# Patient Record
Sex: Female | Born: 1965 | Hispanic: Yes | Marital: Single | Smoking: Never smoker
Health system: Southern US, Community
[De-identification: ages and names within clinical notes are randomized; demographics above are authoritative.]

## PROBLEM LIST (undated history)

## (undated) DIAGNOSIS — K219 Gastro-esophageal reflux disease without esophagitis: Secondary | ICD-10-CM

## (undated) DIAGNOSIS — F329 Major depressive disorder, single episode, unspecified: Secondary | ICD-10-CM

## (undated) DIAGNOSIS — Z8719 Personal history of other diseases of the digestive system: Secondary | ICD-10-CM

## (undated) DIAGNOSIS — G43909 Migraine, unspecified, not intractable, without status migrainosus: Secondary | ICD-10-CM

## (undated) DIAGNOSIS — F32A Depression, unspecified: Secondary | ICD-10-CM

## (undated) DIAGNOSIS — G4700 Insomnia, unspecified: Secondary | ICD-10-CM

## (undated) HISTORY — DX: Depression, unspecified: F32.A

## (undated) HISTORY — DX: Major depressive disorder, single episode, unspecified: F32.9

## (undated) HISTORY — PX: BREAST REDUCTION SURGERY: SHX8

## (undated) HISTORY — DX: Insomnia, unspecified: G47.00

## (undated) HISTORY — DX: Migraine, unspecified, not intractable, without status migrainosus: G43.909

## (undated) HISTORY — PX: TUBAL LIGATION: SHX77

## (undated) HISTORY — DX: Gastro-esophageal reflux disease without esophagitis: K21.9

## (undated) HISTORY — PX: OTHER SURGICAL HISTORY: SHX169

## (undated) HISTORY — DX: Personal history of other diseases of the digestive system: Z87.19

## (undated) HISTORY — PX: UPPER GI ENDOSCOPY: SHX6162

---

## 2015-01-13 ENCOUNTER — Ambulatory Visit: Payer: Self-pay | Admitting: Family Medicine

## 2015-05-28 ENCOUNTER — Telehealth: Payer: Self-pay | Admitting: Family Medicine

## 2015-05-28 NOTE — Telephone Encounter (Signed)
Pt stated she wanted to come in and see you because she has had a migraine since Sunday 05/25/15 and has been gaining weight a few pounds a day. Pt doesn't want to see a PA and is concerned that she is gaining weight because she isn't hungry. Pt stated she didn't sleep last night because he head was hurting so bad she was up crying. Can you work her in your schedule this week? Pt stated she understands if she can not come in today but would like to see you sometime this week if possible. Thanks TNP

## 2015-05-28 NOTE — Telephone Encounter (Signed)
How about tomorrow of Friday at 1030?

## 2015-05-28 NOTE — Telephone Encounter (Signed)
Scheduled pt on Friday 05/30/15 @ 11:15. Ok per Dr. Sullivan LoneGilbert. Pt said to tell Dr. Sullivan LoneGilbert Thank You. Thanks TNP

## 2015-05-30 ENCOUNTER — Encounter: Payer: Self-pay | Admitting: Family Medicine

## 2015-05-30 ENCOUNTER — Ambulatory Visit (INDEPENDENT_AMBULATORY_CARE_PROVIDER_SITE_OTHER): Payer: No Typology Code available for payment source | Admitting: Family Medicine

## 2015-05-30 VITALS — BP 124/62 | HR 76 | Temp 98.0°F | Resp 16 | Wt 178.0 lb

## 2015-05-30 DIAGNOSIS — M79604 Pain in right leg: Secondary | ICD-10-CM

## 2015-05-30 DIAGNOSIS — R1084 Generalized abdominal pain: Secondary | ICD-10-CM

## 2015-05-30 DIAGNOSIS — G43809 Other migraine, not intractable, without status migrainosus: Secondary | ICD-10-CM

## 2015-05-30 DIAGNOSIS — G4700 Insomnia, unspecified: Secondary | ICD-10-CM | POA: Diagnosis not present

## 2015-05-30 DIAGNOSIS — M79606 Pain in leg, unspecified: Secondary | ICD-10-CM | POA: Diagnosis not present

## 2015-05-30 DIAGNOSIS — M79605 Pain in left leg: Secondary | ICD-10-CM

## 2015-05-30 DIAGNOSIS — F329 Major depressive disorder, single episode, unspecified: Secondary | ICD-10-CM

## 2015-05-30 DIAGNOSIS — K219 Gastro-esophageal reflux disease without esophagitis: Secondary | ICD-10-CM | POA: Diagnosis not present

## 2015-05-30 DIAGNOSIS — N921 Excessive and frequent menstruation with irregular cycle: Secondary | ICD-10-CM | POA: Diagnosis not present

## 2015-05-30 DIAGNOSIS — Z780 Asymptomatic menopausal state: Secondary | ICD-10-CM

## 2015-05-30 DIAGNOSIS — F32A Depression, unspecified: Secondary | ICD-10-CM

## 2015-05-30 MED ORDER — SERTRALINE HCL 50 MG PO TABS: 50.0000 mg | ORAL_TABLET | Freq: Every day | ORAL | Status: DC

## 2015-05-30 MED ORDER — SUMATRIPTAN SUCCINATE 50 MG PO TABS: 50.0000 mg | ORAL_TABLET | ORAL | Status: DC | PRN

## 2015-05-30 MED ORDER — TRAZODONE HCL 50 MG PO TABS: 50.0000 mg | ORAL_TABLET | Freq: Every day | ORAL | Status: DC

## 2015-05-30 MED ORDER — OMEPRAZOLE MAGNESIUM 20 MG PO TBEC: 20.0000 mg | DELAYED_RELEASE_TABLET | Freq: Two times a day (BID) | ORAL | Status: DC

## 2015-05-30 NOTE — Progress Notes (Signed)
Elizabeth Munoz  MRN: [id number] DOB: 10/10/1966  Subjective:  HPI  Pt is here because she is having migraine, leg pain, sweat, edema, weight gain and irregular periods. She reports that this has been going on for the last 6 months.   The leg pain has only been a month. She reports that when she lays down at night the leg pain and the headaches are so bad that she cant not sleep.  The pain starts in her hip area and runs down to her knees. She reports this is a un tolerable pain. She is having swelling in her hands and feet. She does not kick around when she sleeps per her husband.   The weight gain has concerned her because she had lost weight and she has gain about 11 lbs. Per her chart it is only 4 but she reports that she had lost weight since she was last seen. She reports that the sigh of food makes her sick to her stomach. She makes herself eat and when she does her GERD is worse.  Her menstrual periods have been irregular as well, she reports that she has been having 3 periods per month for about 6 months to a year, sometimes it will be 1 periods. She reports that her headaches were only around her cycles but now they are happening all the time. She reports that her mom had surgical menopause at 61. With these headaches she had a rash over her head and her right cheek. She has been having hot flashes. She reports that her last pap was about 6 years ago and she has always been normal.   Pt reports that she is Depressed and not sleeping well. There is no reported snoring nor is there any reported RLS.   There are no active problems to display for this patient.   History reviewed. No pertinent past medical history.  History   Social History  . Marital Status: Married    Spouse Name: N/A  . Number of Children: N/A  . Years of Education: N/A   Occupational History  . Not on file.   Social History Main Topics  . Smoking status: Never Smoker   . Smokeless tobacco: Not on  file  . Alcohol Use: Not on file  . Drug Use: No  . Sexual Activity: Not on file   Other Topics Concern  . Not on file   Social History Narrative    Outpatient Prescriptions Prior to Visit  Medication Sig Dispense Refill  . calcium carbonate (TUMS - DOSED IN MG ELEMENTAL CALCIUM) 500 MG chewable tablet Chew 1 tablet by mouth daily.    Marland Kitchen omeprazole (PRILOSEC OTC) 20 MG tablet Take 20 mg by mouth daily.    . cimetidine (TAGAMET) 200 MG tablet Take 200 mg by mouth daily.     No facility-administered medications prior to visit.    No Known Allergies  Review of Systems  Constitutional: Negative.   HENT: Negative.   Eyes: Negative.   Respiratory: Negative.   Cardiovascular: Negative.   Gastrointestinal: Positive for heartburn and nausea.  Genitourinary: Negative.   Musculoskeletal: Positive for myalgias.  Skin: Negative.   Neurological: Negative.   Endo/Heme/Allergies: Negative.   Psychiatric/Behavioral: Positive for depression.   Objective:  BP 124/62 mmHg  Pulse 76  Temp(Src) 98 F (36.7 C) (Oral)  Resp 16  Wt 178 lb (80.74 kg)  Physical Exam  Constitutional: She is oriented to person, place, and time and well-developed, well-nourished, and  in no distress.  HENT:  Head: Normocephalic and atraumatic.  Right Ear: External ear normal.  Left Ear: External ear normal.  Nose: Nose normal.  Mouth/Throat: Oropharynx is clear and moist.  Eyes: Conjunctivae and EOM are normal. Pupils are equal, round, and reactive to light.  Neck: Normal range of motion. Neck supple.  Cardiovascular: Normal rate, regular rhythm, normal heart sounds and intact distal pulses.   Pulmonary/Chest: Effort normal and breath sounds normal.  Abdominal: Soft. Bowel sounds are normal. There is tenderness.  Musculoskeletal: Normal range of motion.  straight leg raises and figure 4 negative.   Neurological: She is alert and oriented to person, place, and time. No cranial nerve deficit. Gait normal.   Skin: Skin is warm and dry.  Psychiatric: Mood, memory, affect and judgment normal.    Assessment and Plan :  1. Pain of lower extremity, unspecified laterality This is a very nonspecific complaint. It seems to bother her more at night when supine. This suggests the possibility of spinal stenosis. Will follow clinically as this seems to be a minor issue for the patient. She has multiple complaints today.    2. Other type of migraine May need neurology referral but this appears to be classic migraine. Hopefully this will also resolve with menopause. She has both photophobia and phonophobia with the headache. - SUMAtriptan (IMITREX) 50 MG tablet; Take 1 tablet (50 mg total) by mouth every 2 (two) hours as needed for migraine. May repeat in 2 hours if headache persists or recurs.  Dispense: 9 tablet; Refill: 12  3. Generalized abdominal pain This is most consistent with the possibility of peptic ulcer disease/gastritis. I think a GI referral will be appropriate. Again, the symptoms are very vague. May need imaging and I will leave this up to GI at this time. - CBC w/Diff - COMPLETE METABOLIC PANEL WITH GFR - H. pylori antibody, IgG - omeprazole (PRILOSEC OTC) 20 MG tablet; Take 1 tablet (20 mg total) by mouth 2 (two) times daily.  Dispense: 60 tablet; Refill: 12  4. Leg pain, bilateral See #1 - Sed Rate (ESR) - CK (Creatine Kinase)  5. Menopause Obtain LH and FSH. - [place] - LH - Ambulatory referral to Obstetrics / Gynecology  6. Menorrhagia with irregular cycle Refer to GYN. Last Pap smear was 6 years ago.- [place] - LH - Ambulatory referral to Obstetrics / Gynecology  7. Depression I think this is the major issue for this patient. Stressed this with patient and husband. Stressed that it will take 4-6 weeks for medication to start help. - TSH - sertraline (ZOLOFT) 50 MG tablet; Take 1 tablet (50 mg total) by mouth daily.  Dispense: 30 tablet; Refill: 12  8. Insomnia Hopefully  will improve his depression improves. - traZODone (DESYREL) 50 MG tablet; Take 1 tablet (50 mg total) by mouth at bedtime.  Dispense: 30 tablet; Refill: 12  9. Gastroesophageal reflux disease, esophagitis presence not specified Has already been taking PPI. Will refer to GI. - omeprazole (PRILOSEC OTC) 20 MG tablet; Take 1 tablet (20 mg total) by mouth 2 (two) times daily.  Dispense: 60 tablet; Refill: [facility] 05/30/2015 11:34 AM

## 2015-05-31 LAB — CMP14+EGFR
ALT: 12 IU/L (ref 0–32)
AST: 17 IU/L (ref 0–40)
Albumin/Globulin Ratio: 1.6 (ref 1.1–2.5)
Albumin: 4.4 g/dL (ref 3.5–5.5)
Alkaline Phosphatase: 89 IU/L (ref 39–117)
BUN/Creatinine Ratio: 32 — ABNORMAL HIGH (ref 9–23)
BUN: 21 mg/dL (ref 6–24)
CO2: 25 mmol/L (ref 18–29)
Calcium: 9.6 mg/dL (ref 8.7–10.2)
Chloride: 102 mmol/L (ref 97–108)
Creatinine, Ser: 0.65 mg/dL (ref 0.57–1.00)
GFR, EST AFRICAN AMERICAN: 119 mL/min/{1.73_m2} (ref >59)
GFR, EST NON AFRICAN AMERICAN: 103 mL/min/{1.73_m2} (ref >59)
GLOBULIN, TOTAL: 2.8 g/dL (ref 1.5–4.5)
Glucose: 71 mg/dL (ref 65–99)
Potassium: 4.8 mmol/L (ref 3.5–5.2)
SODIUM: 141 mmol/L (ref 134–144)
Total Protein: 7.2 g/dL (ref 6.0–8.5)

## 2015-05-31 LAB — CBC WITH DIFFERENTIAL/PLATELET
Basophils Absolute: 0 10*3/uL (ref 0.0–0.2)
Basos: 0 %
EOS (ABSOLUTE): 0.2 10*3/uL (ref 0.0–0.4)
Eos: 2 %
HEMATOCRIT: 39.6 % (ref 34.0–46.6)
Hemoglobin: 12.6 g/dL (ref 11.1–15.9)
IMMATURE GRANS (ABS): 0 10*3/uL (ref 0.0–0.1)
Immature Granulocytes: 0 %
LYMPHS ABS: 2 10*3/uL (ref 0.7–3.1)
Lymphs: 28 %
MCH: 26.2 pg — ABNORMAL LOW (ref 26.6–33.0)
MCHC: 31.8 g/dL (ref 31.5–35.7)
MCV: 82 fL (ref 79–97)
Monocytes Absolute: 0.7 10*3/uL (ref 0.1–0.9)
Monocytes: 9 %
Neutrophils Absolute: 4.2 10*3/uL (ref 1.4–7.0)
Neutrophils: 61 %
Platelets: 350 10*3/uL (ref 150–379)
RBC: 4.81 x10E6/uL (ref 3.77–5.28)
RDW: 13.3 % (ref 12.3–15.4)
WBC: 7 10*3/uL (ref 3.4–10.8)

## 2015-05-31 LAB — CK: Total CK: 69 U/L (ref 24–173)

## 2015-05-31 LAB — FOLLICLE STIMULATING HORMONE: FSH: 13.1 m[IU]/mL

## 2015-05-31 LAB — H. PYLORI ANTIBODY, IGG: H Pylori IgG: 6.3 U/mL — ABNORMAL HIGH (ref 0.0–0.8)

## 2015-05-31 LAB — SEDIMENTATION RATE: Sed Rate: 9 mm/h (ref 0–40)

## 2015-05-31 LAB — LUTEINIZING HORMONE: LH: 10.9 m[IU]/mL

## 2015-05-31 LAB — TSH: TSH: 2.39 u[IU]/mL (ref 0.450–4.500)

## 2015-06-03 ENCOUNTER — Other Ambulatory Visit: Payer: Self-pay | Admitting: Emergency Medicine

## 2015-06-03 ENCOUNTER — Telehealth: Payer: Self-pay | Admitting: Emergency Medicine

## 2015-06-03 DIAGNOSIS — K219 Gastro-esophageal reflux disease without esophagitis: Secondary | ICD-10-CM

## 2015-06-03 DIAGNOSIS — J309 Allergic rhinitis, unspecified: Secondary | ICD-10-CM

## 2015-06-03 DIAGNOSIS — F419 Anxiety disorder, unspecified: Secondary | ICD-10-CM

## 2015-06-03 DIAGNOSIS — A048 Other specified bacterial intestinal infections: Secondary | ICD-10-CM

## 2015-06-03 DIAGNOSIS — R1084 Generalized abdominal pain: Secondary | ICD-10-CM

## 2015-06-03 MED ORDER — ALPRAZOLAM 0.5 MG PO TABS: 0.5000 mg | ORAL_TABLET | Freq: Every evening | ORAL | Status: DC | PRN

## 2015-06-03 MED ORDER — OMEPRAZOLE MAGNESIUM 20 MG PO TBEC: 20.0000 mg | DELAYED_RELEASE_TABLET | Freq: Two times a day (BID) | ORAL | Status: DC

## 2015-06-03 MED ORDER — LORATADINE 10 MG PO TABS: 10.0000 mg | ORAL_TABLET | Freq: Every day | ORAL | Status: DC

## 2015-06-03 NOTE — Telephone Encounter (Signed)
When I called the pt to tell her labs she told me she forgot to ask you for refills. I wanted to make sure they were ok... Omeprazole Loratidine  i know these are probaly ok but she also wants  Alprazolam and Nystatin refill. She said she uses Nystatin for her stomach.

## 2015-06-03 NOTE — Telephone Encounter (Signed)
OK to refill all for 1 year except for Nystatin--does not need that. GI referral pending for those issues.

## 2015-06-03 NOTE — Telephone Encounter (Signed)
Spoke with pt, informed of below. bb

## 2015-06-03 NOTE — Telephone Encounter (Signed)
See other note

## 2015-06-04 ENCOUNTER — Other Ambulatory Visit: Payer: Self-pay | Admitting: Emergency Medicine

## 2015-06-04 DIAGNOSIS — K219 Gastro-esophageal reflux disease without esophagitis: Secondary | ICD-10-CM

## 2015-06-04 DIAGNOSIS — R1084 Generalized abdominal pain: Secondary | ICD-10-CM

## 2015-06-04 MED ORDER — OMEPRAZOLE MAGNESIUM 20 MG PO TBEC: 20.0000 mg | DELAYED_RELEASE_TABLET | Freq: Two times a day (BID) | ORAL | Status: DC

## 2015-06-05 ENCOUNTER — Other Ambulatory Visit: Payer: Self-pay | Admitting: Emergency Medicine

## 2015-06-05 DIAGNOSIS — J329 Chronic sinusitis, unspecified: Secondary | ICD-10-CM

## 2015-06-05 MED ORDER — AMOXICILLIN 500 MG PO CAPS: 500.0000 mg | ORAL_CAPSULE | Freq: Two times a day (BID) | ORAL | Status: DC

## 2015-06-19 ENCOUNTER — Ambulatory Visit: Payer: Self-pay | Admitting: Gastroenterology

## 2015-06-19 ENCOUNTER — Encounter: Payer: Self-pay | Admitting: Obstetrics and Gynecology

## 2015-06-19 ENCOUNTER — Ambulatory Visit (INDEPENDENT_AMBULATORY_CARE_PROVIDER_SITE_OTHER): Payer: No Typology Code available for payment source | Admitting: Obstetrics and Gynecology

## 2015-06-19 VITALS — BP 126/76 | HR 83 | Ht 64.0 in | Wt 174.5 lb

## 2015-06-19 DIAGNOSIS — Z124 Encounter for screening for malignant neoplasm of cervix: Secondary | ICD-10-CM | POA: Diagnosis not present

## 2015-06-19 DIAGNOSIS — G43909 Migraine, unspecified, not intractable, without status migrainosus: Secondary | ICD-10-CM

## 2015-06-19 DIAGNOSIS — F39 Unspecified mood [affective] disorder: Secondary | ICD-10-CM

## 2015-06-19 DIAGNOSIS — N921 Excessive and frequent menstruation with irregular cycle: Secondary | ICD-10-CM

## 2015-06-19 DIAGNOSIS — N943 Premenstrual tension syndrome: Secondary | ICD-10-CM

## 2015-06-19 DIAGNOSIS — Z1239 Encounter for other screening for malignant neoplasm of breast: Secondary | ICD-10-CM

## 2015-06-19 DIAGNOSIS — N938 Other specified abnormal uterine and vaginal bleeding: Secondary | ICD-10-CM

## 2015-06-19 DIAGNOSIS — K219 Gastro-esophageal reflux disease without esophagitis: Secondary | ICD-10-CM | POA: Insufficient documentation

## 2015-06-19 HISTORY — DX: Migraine, unspecified, not intractable, without status migrainosus: G43.909

## 2015-06-19 MED ORDER — MEDROXYPROGESTERONE ACETATE 10 MG PO TABS: 10.0000 mg | ORAL_TABLET | Freq: Every day | ORAL | Status: DC

## 2015-06-19 NOTE — Progress Notes (Signed)
Patient ID: Elizabeth Munoz, female   DOB: [birth year], 49 y.o.   MRN: [id number]  Pt. Referred from Dr. Sullivan LoneGilbert for heavy/irregular periods. This has worsened for past 9 months. C/O hot flashes and night sweats, acne (seen dermatology for this). Also c/o weight gain. Pt. Took ocp's but they were too expensive and pt. Had no insurance at the time. They did help with menses, weight control and acne. [facility] but not in the past 2 yrs. Pt. And husband state she has not had an ultrasound or biopsy in the past. Had CT scan for migraine headaches in the past-negative.

## 2015-06-22 DIAGNOSIS — N921 Excessive and frequent menstruation with irregular cycle: Secondary | ICD-10-CM | POA: Insufficient documentation

## 2015-06-22 DIAGNOSIS — N943 Premenstrual tension syndrome: Secondary | ICD-10-CM

## 2015-06-22 DIAGNOSIS — F39 Unspecified mood [affective] disorder: Secondary | ICD-10-CM | POA: Insufficient documentation

## 2015-06-22 HISTORY — DX: Premenstrual tension syndrome: N94.3

## 2015-06-22 NOTE — Progress Notes (Signed)
Subjective:     Elizabeth Munoz is a 49 y.o. [phone] woman who presents for irregular menses. Patient's last menstrual period was 06/05/2015 (approximate). Menarche age: [age]. Periods are irregular q 2 weeks, lasting 5-6 days, with passage of large clots.  Symptoms have worsened over the past 9 months.  Uses super sized pads for menstrual cycles.  Dysmenorrhea:moderate, occurring throughout menses. Cyclic symptoms include: headache, irritability, moodiness, weight gain, depression, and hot flushes.  Also reports loss of libido x 1 year. Has recurring acne,  Was initiated on OCPs ~ 2 years ago by PCP, which helped with acne but did not help menses.  Menses affects ADL in that patient cannot work, and avoids social events during menses due to heavy flow and migraines. Is taking Xanax and Zoloft for depression. Uses Imitrex for migraines. Current contraception: tubal ligation.  History of abnormal Pap smear: no.  Last pap smear was ~ 2-3 years ago.    Past Medical History  Diagnosis Date  . Insomnia   . GERD (gastroesophageal reflux disease)   . Depression    Family History  Problem Relation Age of Onset  . Alzheimer's disease Mother   . Diabetes Father   . Colon cancer Father   . Diabetes Brother    Past Surgical History  Procedure Laterality Date  . Breast reduction surgery    . Cesarean section      x 2   . History   Social History  . Marital Status: Married    Spouse Name: N/A  . Number of Children: N/A  . Years of Education: N/A   Occupational History  . Not on file.   Social History Main Topics  . Smoking status: Never Smoker   . Smokeless tobacco: Not on file  . Alcohol Use: No  . Drug Use: No  . Sexual Activity: Not Currently   Other Topics Concern  . Not on file   Social History Narrative   Current Outpatient Prescriptions on File Prior to Visit  Medication Sig Dispense Refill  . ALPRAZolam (XANAX) 0.5 MG tablet Take 1 tablet (0.5 mg total) by mouth at  bedtime as needed for anxiety. 30 tablet 5  . calcium carbonate (TUMS - DOSED IN MG ELEMENTAL CALCIUM) 500 MG chewable tablet Chew 1 tablet by mouth daily.    Marland Kitchen. loratadine (CLARITIN) 10 MG tablet Take 1 tablet (10 mg total) by mouth daily. 30 tablet 12  . omeprazole (PRILOSEC OTC) 20 MG tablet Take 1 tablet (20 mg total) by mouth 2 (two) times daily. 60 tablet 12  . sertraline (ZOLOFT) 50 MG tablet Take 1 tablet (50 mg total) by mouth daily. 30 tablet 12  . SUMAtriptan (IMITREX) 50 MG tablet Take 1 tablet (50 mg total) by mouth every 2 (two) hours as needed for migraine. May repeat in 2 hours if headache persists or recurs. 9 tablet 12  . traZODone (DESYREL) 50 MG tablet Take 1 tablet (50 mg total) by mouth at bedtime. 30 tablet 12  . nystatin (MYCOSTATIN) 100000 UNIT/ML suspension Take 5 mLs by mouth 4 (four) times daily.     No current facility-administered medications on file prior to visit.     Review of Systems Pertinent items are noted in HPI.     Objective:    BP 126/76 mmHg  Pulse 83  Ht 5\' 4"  (1.626 m)  Wt 174 lb 8 oz (79.153 kg)  BMI 29.94 kg/m2  LMP 06/05/2015 (Approximate) General appearance: alert and no distress Throat:  lips, mucosa, and tongue normal; teeth and gums normal Neck: supple, symmetrical, trachea midline and thyroid not enlarged, symmetric, no tenderness/mass/nodules Abdomen: soft, non-tender; bowel sounds normal; no masses,  no organomegaly Pelvic: cervix normal in appearance, external genitalia normal, no adnexal masses or tenderness, no cervical motion tenderness, positive findings: uterine enlargement 12 week size, rectovaginal septum normal and vagina normal without discharge Extremities: extremities normal, atraumatic, no cyanosis or edema Skin: Skin color, texture, turgor normal. No rashes or lesions Neurologic: Grossly normal      Lab Review:  Lab Results  Component Value Date   WBC 7.0 05/30/2015   HCT 39.6 05/30/2015   Lab Results   Component Value Date   TSH 2.390 05/30/2015    Results for REYES SHONDRA, CAPPS (MRN [id number]) as of 06/22/2015 15:24  Ref. Range 05/30/2015 12:43  LH Latest Units: mIU/mL 10.9  FSH Latest Units: mIU/mL 13.1     Assessment:    The patient has menometrorrhagia, possibly perimenopausal bleeding with PMS symptoms.    Plan:    Pap smear. Pelvic ultrasound.   Patient has abnormal uterine bleeding. She has a normal exam, no evidence of lesions. Will perform endometrial biopsy at next visit. Discussed management options for abnormal uterine bleeding including tranexamic acid (Lysteda), oral progesterone, Depo Provera, Mirena IUD, endometrial ablation (Novasure/Hydrothermal Ablation) or hysterectomy as definitive surgical management.  Discussed risks and benefits of each method.   Patient is considering hysterectomy. Given handouts on several available options for patients to review at home.  Provera prescribed as needed for now,  bleeding precautions reviewed.  RTC in 1-2 weeks.  Patient also desires referral for Mammogram as it has been several years since last one.   Hildred Laser, MD [facility]

## 2015-06-24 ENCOUNTER — Ambulatory Visit (INDEPENDENT_AMBULATORY_CARE_PROVIDER_SITE_OTHER): Payer: No Typology Code available for payment source | Admitting: Gastroenterology

## 2015-06-24 ENCOUNTER — Other Ambulatory Visit: Payer: Self-pay

## 2015-06-24 VITALS — BP 145/77 | HR 105 | Temp 98.7°F | Ht 64.0 in | Wt 176.0 lb

## 2015-06-24 DIAGNOSIS — R140 Abdominal distension (gaseous): Secondary | ICD-10-CM | POA: Diagnosis not present

## 2015-06-24 DIAGNOSIS — B9681 Helicobacter pylori [H. pylori] as the cause of diseases classified elsewhere: Secondary | ICD-10-CM

## 2015-06-24 DIAGNOSIS — G8929 Other chronic pain: Secondary | ICD-10-CM

## 2015-06-24 DIAGNOSIS — R1013 Epigastric pain: Secondary | ICD-10-CM

## 2015-06-24 DIAGNOSIS — A048 Other specified bacterial intestinal infections: Secondary | ICD-10-CM

## 2015-06-24 LAB — PAP IG AND HPV HIGH-RISK
HPV, high-risk: NEGATIVE
PAP Smear Comment: 0

## 2015-06-24 MED ORDER — BIS SUBCIT-METRONID-TETRACYC 140-125-125 MG PO CAPS: 3.0000 | ORAL_CAPSULE | Freq: Three times a day (TID) | ORAL | Status: DC

## 2015-06-24 NOTE — Progress Notes (Signed)
Gastroenterology Consultation  Referring Provider:     Maple Hudson.,* Primary Care Physician:  Megan Mans, MD Primary Gastroenterologist:  Dr. Servando Snare     Reason for Consultation:     Abdominal pain and H. pylori infection        HPI:   Elizabeth Munoz is a 49 y.o. y/o female referred for consultation & management of abdominal pain and H. pylori infection by Dr. Megan Mans, MD.  This patient comes today with a few months of abdominal pain. The patient states that over the last few months she has had worsening abdominal pain with reflux and a lot of burping. The patient also reports that abdominal pain is in the epigastric area. The patient was put on omeprazole and states that her medication did not stop her symptoms. The patient also states she was put on nystatin but is not sure why she was put on nystatin. The patient now comes with a report of the pain interfering with her life. She states she eats very small amounts of food and has been gaining weight. She also reports that she has been less active. The patient comes with her husband who reports that she does not eat a lot but she does not move around a lot. The patient was having symptoms usually at night about 4 hours after dinner and typically in the middle of night she wakes up with a feeling of choking. The patient was found to have H. pylori positive but has not been treated for that yet. Told 3 weeks ago to stop all of her medication because of the symptoms and to see me. She states that in the last 2 weeks her symptoms have been worse.  Past Medical History  Diagnosis Date  . Insomnia   . GERD (gastroesophageal reflux disease)   . Depression   . Migraine 06/19/2015    Past Surgical History  Procedure Laterality Date  . Breast reduction surgery    . Cesarean section      x 2    Prior to Admission medications   Medication Sig Start Date End Date Taking? Authorizing Provider  ALPRAZolam [name]) 0.5  MG tablet Take 1 tablet (0.5 mg total) by mouth at bedtime as needed for anxiety. 06/03/15  Yes Richard Hulen Shouts., MD  calcium carbonate (TUMS - DOSED IN MG ELEMENTAL CALCIUM) 500 MG chewable tablet Chew 1 tablet by mouth daily.   Yes Historical Provider, MD  loratadine (CLARITIN) 10 MG tablet Take 1 tablet (10 mg total) by mouth daily. 06/03/15  Yes Richard Hulen Shouts., MD  Multiple Vitamin (MULTIVITAMIN) tablet Take 1 tablet by mouth daily.   Yes Historical Provider, MD  omeprazole (PRILOSEC OTC) 20 MG tablet Take 1 tablet (20 mg total) by mouth 2 (two) times daily. 06/04/15  Yes Richard Hulen Shouts., MD  sertraline (ZOLOFT) 50 MG tablet Take 1 tablet (50 mg total) by mouth daily. 05/30/15  Yes Richard Hulen Shouts., MD  SUMAtriptan (IMITREX) 50 MG tablet Take 1 tablet (50 mg total) by mouth every 2 (two) hours as needed for migraine. May repeat in 2 hours if headache persists or recurs. 05/30/15  Yes Richard Hulen Shouts., MD  traZODone (DESYREL) 50 MG tablet Take 1 tablet (50 mg total) by mouth at bedtime. 05/30/15  Yes Richard Hulen Shouts., MD  Verapamil HCl CR 200 MG CP24 Take by mouth.   Yes Historical Provider, MD  medroxyPROGESTERone (PROVERA) 10  MG tablet Take 1 tablet (10 mg total) by mouth daily. Use for ten days Patient not taking: Reported on 06/24/2015 06/19/15   Hildred LaserAnika Cherry, MD  nystatin (MYCOSTATIN) 100000 UNIT/ML suspension Take 5 mLs by mouth 4 (four) times daily.    Historical Provider, MD    Family History  Problem Relation Age of Onset  . Alzheimer's disease Mother   . Diabetes Father   . Diabetes Brother   . Prostate cancer Father      History  Substance Use Topics  . Smoking status: Never Smoker   . Smokeless tobacco: Not on file  . Alcohol Use: No    Allergies as of 06/24/2015  . (No Known Allergies)    Review of Systems:    All systems reviewed and negative except where noted in HPI.   Physical Exam:  BP 145/77 mmHg  Pulse 105  Temp(Src) 98.7 F (37.1  C) (Oral)  Ht 5\' 4"  (1.626 m)  Wt 176 lb (79.833 kg)  BMI 30.20 kg/m2  LMP 06/05/2015 (Approximate) Patient's last menstrual period was 06/05/2015 (approximate). Psych:  Alert and cooperative. Normal mood and affect. General:   Alert,  Well-developed, well-nourished, pleasant and cooperative in NAD Head:  Normocephalic and atraumatic. Eyes:  Sclera clear, no icterus.   Conjunctiva pink. Ears:  Normal auditory acuity. Nose:  No deformity, discharge, or lesions. Mouth:  No deformity or lesions,oropharynx pink & moist. Neck:  Supple; no masses or thyromegaly. Lungs:  Respirations even and unlabored.  Clear throughout to auscultation.   No wheezes, crackles, or rhonchi. No acute distress. Heart:  Regular rate and rhythm; no murmurs, clicks, rubs, or gallops. Abdomen:  Normal bowel sounds.  No bruits.  Soft, non-tender and non-distended without masses, hepatosplenomegaly or hernias noted.  No guarding or rebound tenderness.  Negative Carnett sign.   Rectal:  Deferred.  Msk:  Symmetrical without gross deformities.  Good, equal movement & strength bilaterally. Pulses:  Normal pulses noted. Extremities:  No clubbing or edema.  No cyanosis. Neurologic:  Alert and oriented x3;  grossly normal neurologically. Skin:  Intact without significant lesions or rashes.  No jaundice. Lymph Nodes:  No significant cervical adenopathy. Psych:  Alert and cooperative. Normal mood and affect.  Imaging Studies: No results found.  Assessment and Plan:   Elizabeth Munoz is a 49 y.o. y/o female who has H. pylori infection and has not been treated for it yet. The patient also has abdominal pain with most the pain in the epigastric area she also reports that she has a lot of burping with pain associated with burping. She states that it is worse at night when she lays down and can be up to 4 hours after eating. The patient denies any carbonated drank intake or other risk factors for aerophagia. These symptoms  have been so bothersome over the last few months that she states she is not as active as she has been in the past. She also reports that she has a family history of peptic ulcer disease. The patient will be started on treatment for her H. pylori. If her symptoms do not improve then she may need to be on a chronic PPI versus an upper endoscopy versus a CT scan of the abdomen. The patient has also been told that she will need to have her stool checked in 6 weeks to verify eradication of H. Pylori. The patient and her husband have been explained the plan and agrees with it.

## 2015-06-25 ENCOUNTER — Encounter: Payer: Self-pay | Admitting: Obstetrics and Gynecology

## 2015-06-25 ENCOUNTER — Ambulatory Visit (INDEPENDENT_AMBULATORY_CARE_PROVIDER_SITE_OTHER): Payer: No Typology Code available for payment source | Admitting: Obstetrics and Gynecology

## 2015-06-25 ENCOUNTER — Ambulatory Visit: Payer: No Typology Code available for payment source

## 2015-06-25 VITALS — BP 120/80 | HR 91 | Ht 64.0 in | Wt 174.1 lb

## 2015-06-25 DIAGNOSIS — Z8719 Personal history of other diseases of the digestive system: Secondary | ICD-10-CM | POA: Insufficient documentation

## 2015-06-25 DIAGNOSIS — Z1239 Encounter for other screening for malignant neoplasm of breast: Secondary | ICD-10-CM

## 2015-06-25 DIAGNOSIS — N938 Other specified abnormal uterine and vaginal bleeding: Secondary | ICD-10-CM

## 2015-06-25 DIAGNOSIS — N939 Abnormal uterine and vaginal bleeding, unspecified: Secondary | ICD-10-CM

## 2015-06-25 NOTE — Progress Notes (Signed)
GYNECOLOGY PROGRESS NOTE  Subjective:    Patient ID: Elizabeth Munoz, female    DOB: [birth year], 49 y.o.   MRN: [id number]  HPI  Patient is a 49 y.o. [id number] female who presents for f/u after ultrasound and for endometrial biopsy. Denies complaints today.     Review of Systems A comprehensive review of systems was negative.   Objective:   Blood pressure 120/80, pulse 91, height 5\' 4"  (1.626 m), weight 174 lb 1 oz (78.954 kg), last menstrual period 06/05/2015. General appearance: alert and no distress Exam deferred,  See Endometrial Biopsy Procedure Note    Labs:  Ultrasound 06/25/2015 -  The uterus measures 8.9 x 4.1 x 5.5 cm. Echo texture is homogenous without evidence of focal masses.  The Endometrium measures 11.6 mm.  Right Ovary measures 2.5 x 1. X 1.9 cm. It is normal in appearance. Left Ovary measures 3.0 x 1.8 x 2.1 cm. It is normal appearance. Dominant follicles seen on ovaries bilaterally. Survey of the adnexa demonstrates no adnexal masses. There is no free fluid in the cul de sac.  Assessment:   Abnormal uterine bleeding with perimenopausal symptoms  Plan:   Patient with normal ultrasound except for thickened endometrial stripe.  No masses present.  Discussed findings with patient.  Discussed management options.  Patient initially desired hysterectomy, but is open to discussing other options.   Discussed all options including tranexamic acid (Lysteda), oral progesterone, Depo Provera, Mirena IUD, endometrial ablation (Novasure/Hydrothermal Ablation) or hysterectomy as definitive surgical management.  Discussed risks and benefits of each method.   Patient unsure as she notes that her insurance is "different" in that it pays for office visits but not medications.  Is currently considering hysterectomy vs Mirena IUD.  Printed patient education handouts were given to the patient to review at home. Will assess cost through [employer] company of both options.  Will notify  patient of information received from insurance company and schedule accordingly.  Provera prescribed at last visit for upcoming menses.       Endometrial Biopsy Procedure Note Urine pregnancy test was not done, patient with remote h/o BTL.  The risks (including infection, bleeding, pain, and uterine perforation) and benefits of the procedure were explained to the patient and Verbal informed consent was obtained.     The patient was placed in the dorsal lithotomy position.  Bimanual exam showed the uterus to be in the neutral position.  A Graves' speculum inserted in the vagina, and the cervix prepped with povidone iodine.  A sharp tenaculum was applied to the anterior lip of the cervix for stabilization.  A sterile uterine sound was used to sound the uterus to a depth of 9cm.  A Pipelle endometrial aspirator was used to sample the endometrium.  Sample was sent for pathologic examination.  The patient was advised to call for any fever or for prolonged or severe pain or bleeding. She was advised to use OTC ibuprofen as needed for mild to moderate pain. She was advised to avoid vaginal intercourse for 48 hours or until the bleeding has completely stopped.   Hildred LaserAnika Delshawn Stech, MD [facility]

## 2015-06-26 ENCOUNTER — Other Ambulatory Visit: Payer: Self-pay

## 2015-06-26 DIAGNOSIS — A048 Other specified bacterial intestinal infections: Secondary | ICD-10-CM

## 2015-06-26 MED ORDER — OMEPRAZOLE 20 MG PO CPDR: 20.0000 mg | DELAYED_RELEASE_CAPSULE | Freq: Two times a day (BID) | ORAL | Status: DC

## 2015-06-26 MED ORDER — CLARITHROMYCIN 500 MG PO TABS: 500.0000 mg | ORAL_TABLET | Freq: Two times a day (BID) | ORAL | Status: DC

## 2015-06-26 MED ORDER — AMOXICILLIN 500 MG PO CAPS: 1000.0000 mg | ORAL_CAPSULE | Freq: Two times a day (BID) | ORAL | Status: DC

## 2015-06-27 LAB — PATHOLOGY

## 2015-07-07 ENCOUNTER — Ambulatory Visit: Payer: No Typology Code available for payment source | Admitting: Family Medicine

## 2015-07-14 ENCOUNTER — Telehealth: Payer: Self-pay | Admitting: Obstetrics and Gynecology

## 2015-07-14 ENCOUNTER — Telehealth: Payer: Self-pay | Admitting: Gastroenterology

## 2015-07-14 NOTE — Telephone Encounter (Signed)
In your last note you had stated we could try a PPI, an EGD or CT scan if symptoms do not improve. Shall we start with PPI first?

## 2015-07-14 NOTE — Telephone Encounter (Signed)
Patient called requesting her biopsy results.Thanks

## 2015-07-14 NOTE — Telephone Encounter (Signed)
Pt has now finished her ABX for H pylori and has been doing good but recently the acid reflux is just as bad, The pts husband is calling to see what was the next step, please call wife on the above number

## 2015-07-14 NOTE — Telephone Encounter (Signed)
Dr.Cherry this pt had a biopsy on 06/25/2015 and is calling for the results. Do you want her to come in to discuss results? Pt does not have an appt scheduled currently. Please advise

## 2015-07-15 NOTE — Telephone Encounter (Signed)
Yes please start her on a trial of Dexilant 60 mg a day.

## 2015-07-15 NOTE — Telephone Encounter (Signed)
Dr.Cherry I spoke with Elizabeth Munoz and she states that this pt cannot come in to be seen until mid August. Just FYI.

## 2015-07-15 NOTE — Telephone Encounter (Signed)
Yes please.  The patient should have been scheduled for a f/u appointment 2 weeks after.

## 2015-07-16 NOTE — Telephone Encounter (Signed)
Ok. I will call the patient with her results.  And she can keep her previously scheduled appointment to talk further about management options.

## 2015-07-17 ENCOUNTER — Telehealth: Payer: Self-pay | Admitting: Gastroenterology

## 2015-07-17 NOTE — Telephone Encounter (Signed)
symtoms are coming back and she is finished with antibiotics. Leaving for vacation 8/2. Please advise

## 2015-07-17 NOTE — Telephone Encounter (Signed)
Pt notified that we will give her some samples of Dexilant to try and see if it works for her reflux. Will call back when she returns from her vacation.

## 2015-08-05 ENCOUNTER — Ambulatory Visit (INDEPENDENT_AMBULATORY_CARE_PROVIDER_SITE_OTHER): Payer: No Typology Code available for payment source | Admitting: Obstetrics and Gynecology

## 2015-08-05 ENCOUNTER — Encounter: Payer: Self-pay | Admitting: Obstetrics and Gynecology

## 2015-08-05 VITALS — BP 114/82 | HR 80 | Resp 16 | Ht 64.0 in | Wt 175.0 lb

## 2015-08-05 DIAGNOSIS — N943 Premenstrual tension syndrome: Secondary | ICD-10-CM | POA: Diagnosis not present

## 2015-08-05 DIAGNOSIS — R202 Paresthesia of skin: Secondary | ICD-10-CM | POA: Diagnosis not present

## 2015-08-05 DIAGNOSIS — M7989 Other specified soft tissue disorders: Secondary | ICD-10-CM

## 2015-08-05 DIAGNOSIS — N921 Excessive and frequent menstruation with irregular cycle: Secondary | ICD-10-CM

## 2015-08-05 MED ORDER — GABAPENTIN 600 MG PO TABS: 600.0000 mg | ORAL_TABLET | Freq: Every day | ORAL | Status: DC

## 2015-08-05 MED ORDER — FUROSEMIDE 20 MG PO TABS: 10.0000 mg | ORAL_TABLET | Freq: Every day | ORAL | Status: DC | PRN

## 2015-08-05 NOTE — Progress Notes (Signed)
GYNECOLOGY PROGRESS NOTE  Subjective:    Patient ID: Elizabeth Munoz, female    DOB: November 07, 1966, 49 y.o.   MRN: [id number]  HPI  Patient is a 49 y.o. G16P0202 female who presents for follow up of menometrorrhagia.  Patient is s/p endometrial biopsy and ultrasound.  To discuss results and further treatment options.   The following portions of the patient's history were reviewed and updated as appropriate: allergies, current medications, past family history, past medical history, past social history, past surgical history and problem list.  Review of Systems A comprehensive review of systems was negative except for: Cardiovascular: positive for lower extremity edema Genitourinary: positive for abnormal menstrual periods and dysmenorrhea Neurological: positive for paresthesias of lower extremities and hands.  Mostly occurs at night. Behavioral/Psych: positive for mood swings   Objective:   Blood pressure 114/82, pulse 80, resp. rate 16, height  (1.626 m), weight 175 lb (79.379 kg). General appearance: alert and no distress Exam deferred.    Pathology: 06/19/2015: Pap negative.  06/25/2015: Endometrial biopsy - Secretory endometrium.  No evidence of hyperplasia or endometrial intraepithelial neoplasia.   Imaging (06/25/2015): The uterus measures 8.9 x 4.1 x 5.5 cm. Echo texture is homogenous without evidence of focal masses.  The Endometrium measures 11.6 mm. Right Ovary measures 2.5 x 1. X 1.9 cm. It is normal in appearance.  Left Ovary measures 3.0 x 1.8 x 2.1 cm. It is normal appearance.  Dominant follicles seen on ovaries bilaterally.  Survey of the adnexa demonstrates no adnexal masses. There is no free fluid in the cul de sac.  Assessment:   Menometrorrhagia Premenstrual syndrome (perimenopausal) Leg swelling Extremity paresthesias.   Plan:   Menometrorrhagia - Discussed management options for abnormal uterine bleeding including tranexamic acid (Lysteda), oral  progesterone, Depo Provera, Mirena IUD, endometrial ablation (Novasure/Hydrothermal Ablation) or hysterectomy as definitive surgical management.  Discussed risks and benefits of each method.   Patient desires definitive therapy with hysterectomy.  Printed patient education handouts were given to the patient to review at home. Has Provera prescribed as needed for now,  bleeding precautions reviewed. RTC in 1 week for pre-op.  Desires surgery in September.  Will tentatively schedule for 09/01/2015. Based on history and prior exam, would be a candidate for TLH vs LAVH.  Premenstrual syndrome - discussion had on preservation of ovaries vs removal (as this is typically the cause of symptoms). Patient desires removal at time of hysterectomy.  Leg swelling - has been occuring over past 6 months.  Occurs premenstrually and lasts for up to 1 week after menses.  Has tried Midol in the past. Could be hormonal, but cannot r/o other causes.  Denies cardiovascular symptoms, BP wnl. Will prescribe Lasix 10 mg to use daily, prn for short term management.  Extremity paresthesias - Has also been occuring over the past 6 months. Intermittent, mostly at night but also can occur during the day.  Will prescribe short term gabapentin to take at night as needed.    A total of 25 minutes were spent face-to-face with the patient during this encounter and over half of that time dealt with counseling and coordination of care.   Hildred Laser, MD [facility]

## 2015-08-12 ENCOUNTER — Ambulatory Visit: Payer: No Typology Code available for payment source | Admitting: Obstetrics and Gynecology

## 2015-08-26 ENCOUNTER — Encounter: Payer: No Typology Code available for payment source | Admitting: Obstetrics and Gynecology

## 2015-08-26 ENCOUNTER — Other Ambulatory Visit: Payer: Self-pay

## 2015-09-01 ENCOUNTER — Ambulatory Visit
Admission: RE | Admit: 2015-09-01 | Payer: No Typology Code available for payment source | Source: Ambulatory Visit | Admitting: Obstetrics and Gynecology

## 2015-09-01 ENCOUNTER — Encounter: Admission: RE | Payer: Self-pay | Source: Ambulatory Visit

## 2015-09-01 SURGERY — HYSTERECTOMY, TOTAL, LAPAROSCOPIC
Anesthesia: Choice | Laterality: Bilateral

## 2015-10-09 ENCOUNTER — Ambulatory Visit: Payer: No Typology Code available for payment source | Admitting: Family Medicine

## 2015-12-23 ENCOUNTER — Telehealth: Payer: Self-pay | Admitting: Family Medicine

## 2015-12-23 ENCOUNTER — Ambulatory Visit (INDEPENDENT_AMBULATORY_CARE_PROVIDER_SITE_OTHER): Payer: BLUE CROSS/BLUE SHIELD | Admitting: Family Medicine

## 2015-12-23 ENCOUNTER — Other Ambulatory Visit: Payer: Self-pay | Admitting: Family Medicine

## 2015-12-23 VITALS — BP 134/90 | HR 84 | Temp 98.2°F | Resp 16 | Wt 178.0 lb

## 2015-12-23 DIAGNOSIS — G43809 Other migraine, not intractable, without status migrainosus: Secondary | ICD-10-CM | POA: Diagnosis not present

## 2015-12-23 DIAGNOSIS — R05 Cough: Secondary | ICD-10-CM

## 2015-12-23 DIAGNOSIS — A048 Other specified bacterial intestinal infections: Secondary | ICD-10-CM

## 2015-12-23 DIAGNOSIS — B9681 Helicobacter pylori [H. pylori] as the cause of diseases classified elsewhere: Secondary | ICD-10-CM

## 2015-12-23 DIAGNOSIS — G4700 Insomnia, unspecified: Secondary | ICD-10-CM

## 2015-12-23 DIAGNOSIS — F329 Major depressive disorder, single episode, unspecified: Secondary | ICD-10-CM | POA: Diagnosis not present

## 2015-12-23 DIAGNOSIS — J019 Acute sinusitis, unspecified: Secondary | ICD-10-CM | POA: Diagnosis not present

## 2015-12-23 DIAGNOSIS — R059 Cough, unspecified: Secondary | ICD-10-CM

## 2015-12-23 DIAGNOSIS — F32A Depression, unspecified: Secondary | ICD-10-CM

## 2015-12-23 MED ORDER — SUMATRIPTAN SUCCINATE 50 MG PO TABS: 50.0000 mg | ORAL_TABLET | ORAL | Status: DC | PRN

## 2015-12-23 MED ORDER — SERTRALINE HCL 50 MG PO TABS: 50.0000 mg | ORAL_TABLET | Freq: Every day | ORAL | Status: DC

## 2015-12-23 MED ORDER — AMOXICILLIN-POT CLAVULANATE 875-125 MG PO TABS: 1.0000 | ORAL_TABLET | Freq: Two times a day (BID) | ORAL | Status: DC

## 2015-12-23 MED ORDER — FUROSEMIDE 20 MG PO TABS: 10.0000 mg | ORAL_TABLET | Freq: Every day | ORAL | Status: DC | PRN

## 2015-12-23 MED ORDER — GABAPENTIN 600 MG PO TABS: 600.0000 mg | ORAL_TABLET | Freq: Every day | ORAL | Status: DC

## 2015-12-23 MED ORDER — VERAPAMIL HCL ER 200 MG PO CP24
1.0000 | ORAL_CAPSULE | Freq: Every day | ORAL | Status: DC
Start: 2015-12-23 — End: 2017-06-06

## 2015-12-23 MED ORDER — TRAZODONE HCL 50 MG PO TABS: 50.0000 mg | ORAL_TABLET | Freq: Every day | ORAL | Status: DC

## 2015-12-23 MED ORDER — OMEPRAZOLE 20 MG PO CPDR: 20.0000 mg | DELAYED_RELEASE_CAPSULE | Freq: Two times a day (BID) | ORAL | Status: DC

## 2015-12-23 NOTE — Progress Notes (Signed)
Patient ID: Elizabeth Munoz, female   DOB: 1966-05-16, 50 y.o.   MRN: [id number]   Elizabeth Munoz  MRN: [id number] DOB: 1966-05-21  Subjective:  HPI   1. Cough The patient is a 50 year old female who presents for evaluation of her cough and cold symptoms.  She states they started 8 days ago.  She has had cough, sinus drainage at times, dry throat and nose, ear pressure, facial pain, and headache.  She has been taking Mucinex D and got some relief.  Her cough is productive of yellow to green.  She has a trip to [place] next week to see her father who has terminal cancer.    Patient Active Problem List   Diagnosis Date Noted  . H/O pyloric stenosis   . Menometrorrhagia 06/22/2015  . PMS (premenstrual syndrome) 06/22/2015  . Mood disorder (HCC) 06/22/2015  . Migraine 06/19/2015  . GERD (gastroesophageal reflux disease)     Past Medical History  Diagnosis Date  . Insomnia   . GERD (gastroesophageal reflux disease)   . Depression   . Migraine 06/19/2015  . H/O pyloric stenosis     06/24/2015 noted also by Dr. Sullivan Lone    Social History   Social History  . Marital Status: Married    Spouse Name: N/A  . Number of Children: N/A  . Years of Education: N/A   Occupational History  . Not on file.   Social History Main Topics  . Smoking status: Never Smoker   . Smokeless tobacco: Not on file  . Alcohol Use: No  . Drug Use: No  . Sexual Activity: Not Currently   Other Topics Concern  . Not on file   Social History Narrative    Outpatient Prescriptions Prior to Visit  Medication Sig Dispense Refill  . ALPRAZolam (XANAX) 0.5 MG tablet Take 1 tablet (0.5 mg total) by mouth at bedtime as needed for anxiety. 30 tablet 5  . furosemide (LASIX) 20 MG tablet Take 0.5 tablets (10 mg total) by mouth daily as needed for fluid. 30 tablet 1  . gabapentin (NEURONTIN) 600 MG tablet Take 1 tablet (600 mg total) by mouth at bedtime. 30 tablet 3  . loratadine (CLARITIN) 10 MG tablet  Take 1 tablet (10 mg total) by mouth daily. 30 tablet 12  . Multiple Vitamin (MULTIVITAMIN) tablet Take 1 tablet by mouth daily.    Marland Kitchen omeprazole (PRILOSEC OTC) 20 MG tablet Take 1 tablet (20 mg total) by mouth 2 (two) times daily. 60 tablet 12  . omeprazole (PRILOSEC) 20 MG capsule Take 1 capsule (20 mg total) by mouth 2 (two) times daily before a meal. 28 capsule 0  . sertraline (ZOLOFT) 50 MG tablet Take 1 tablet (50 mg total) by mouth daily. 30 tablet 12  . SUMAtriptan (IMITREX) 50 MG tablet Take 1 tablet (50 mg total) by mouth every 2 (two) hours as needed for migraine. May repeat in 2 hours if headache persists or recurs. 9 tablet 12  . traZODone (DESYREL) 50 MG tablet Take 1 tablet (50 mg total) by mouth at bedtime. 30 tablet 12  . Verapamil HCl CR 200 MG CP24 Take by mouth.    Marland Kitchen amoxicillin (AMOXIL) 500 MG capsule Take 2 capsules (1,000 mg total) by mouth 2 (two) times daily. (Patient not taking: Reported on 08/05/2015) 56 capsule 0  . bismuth-metronidazole-tetracycline (PYLERA) 140-125-125 MG per capsule Take 3 capsules by mouth 4 (four) times daily -  before meals and at bedtime. 120 capsule 0  .  calcium carbonate (TUMS - DOSED IN MG ELEMENTAL CALCIUM) 500 MG chewable tablet Chew 1 tablet by mouth daily.    . clarithromycin (BIAXIN) 500 MG tablet Take 1 tablet (500 mg total) by mouth 2 (two) times daily. 28 tablet 0  . medroxyPROGESTERone (PROVERA) 10 MG tablet Take 1 tablet (10 mg total) by mouth daily. Use for ten days 10 tablet 2  . nystatin (MYCOSTATIN) 100000 UNIT/ML suspension Take 5 mLs by mouth 4 (four) times daily.     No facility-administered medications prior to visit.   Outpatient Encounter Prescriptions as of 12/23/2015  Medication Sig  . ALPRAZolam (XANAX) 0.5 MG tablet Take 1 tablet (0.5 mg total) by mouth at bedtime as needed for anxiety.  . furosemide (LASIX) 20 MG tablet Take 0.5 tablets (10 mg total) by mouth daily as needed for fluid.  Marland Kitchen. gabapentin (NEURONTIN) 600 MG  tablet Take 1 tablet (600 mg total) by mouth at bedtime.  Marland Kitchen. loratadine (CLARITIN) 10 MG tablet Take 1 tablet (10 mg total) by mouth daily.  . Multiple Vitamin (MULTIVITAMIN) tablet Take 1 tablet by mouth daily.  Marland Kitchen. omeprazole (PRILOSEC OTC) 20 MG tablet Take 1 tablet (20 mg total) by mouth 2 (two) times daily.  Marland Kitchen. omeprazole (PRILOSEC) 20 MG capsule Take 1 capsule (20 mg total) by mouth 2 (two) times daily before a meal.  . sertraline (ZOLOFT) 50 MG tablet Take 1 tablet (50 mg total) by mouth daily.  . SUMAtriptan (IMITREX) 50 MG tablet Take 1 tablet (50 mg total) by mouth every 2 (two) hours as needed for migraine. May repeat in 2 hours if headache persists or recurs.  . traZODone (DESYREL) 50 MG tablet Take 1 tablet (50 mg total) by mouth at bedtime.  . Verapamil HCl CR 200 MG CP24 Take by mouth.  . [DISCONTINUED] amoxicillin (AMOXIL) 500 MG capsule Take 2 capsules (1,000 mg total) by mouth 2 (two) times daily. (Patient not taking: Reported on 08/05/2015)  . [DISCONTINUED] bismuth-metronidazole-tetracycline (PYLERA) 140-125-125 MG per capsule Take 3 capsules by mouth 4 (four) times daily -  before meals and at bedtime.  . [DISCONTINUED] calcium carbonate (TUMS - DOSED IN MG ELEMENTAL CALCIUM) 500 MG chewable tablet Chew 1 tablet by mouth daily.  . [DISCONTINUED] clarithromycin (BIAXIN) 500 MG tablet Take 1 tablet (500 mg total) by mouth 2 (two) times daily.  . [DISCONTINUED] medroxyPROGESTERone (PROVERA) 10 MG tablet Take 1 tablet (10 mg total) by mouth daily. Use for ten days  . [DISCONTINUED] nystatin (MYCOSTATIN) 100000 UNIT/ML suspension Take 5 mLs by mouth 4 (four) times daily.   No facility-administered encounter medications on file as of 12/23/2015.    No Known Allergies  Review of Systems  Constitutional: Positive for malaise/fatigue. Negative for fever and chills.  HENT: Positive for congestion, sore throat and tinnitus. Negative for ear discharge, ear pain, hearing loss and nosebleeds.    Eyes: Negative for blurred vision, double vision, photophobia, pain, discharge and redness.  Respiratory: Positive for cough and sputum production. Negative for hemoptysis, shortness of breath and wheezing.   Cardiovascular: Negative for chest pain, palpitations, orthopnea and leg swelling.  Neurological: Positive for weakness and headaches.   Objective:  BP 134/90 mmHg  Pulse 84  Temp(Src) 98.2 F (36.8 C) (Oral)  Resp 16  Wt 178 lb (80.74 kg)  LMP 12/04/2015 (Exact Date)  Physical Exam  Constitutional: She is well-developed, well-nourished, and in no distress.  HENT:  Head: Normocephalic and atraumatic.  Right Ear: External ear normal.  Left Ear:  External ear normal.  Nose: Nose normal.  Mouth/Throat: Oropharynx is clear and moist.  Maxillary sinuses tender and swollen. Right greater than left.  Eyes: Conjunctivae and EOM are normal. Pupils are equal, round, and reactive to light.  Neck: Normal range of motion. Neck supple.  Cardiovascular: Normal rate, regular rhythm and normal heart sounds.   Pulmonary/Chest: Effort normal and breath sounds normal.  Skin: Skin is warm and dry.  Psychiatric: Mood, memory, affect and judgment normal.    Assessment and Plan :    1. Cough Recommend Mucinex, Nettie Pot and increased fluids.  - amoxicillin-clavulanate (AUGMENTIN) 875-125 MG tablet; Take 1 tablet by mouth 2 (two) times daily.  Dispense: 20 tablet; Refill: 0  2. Acute sinusitis, recurrence not specified, unspecified location/Maxillary  - amoxicillin-clavulanate (AUGMENTIN) 875-125 MG tablet; Take 1 tablet by mouth 2 (two) times daily.  Dispense: 20 tablet; Refill: 0 If patient is not improved in 5-6 days we'll call in a prednisone taper. Netti pot recommended.  I have done the exam and reviewed the above chart and it is accurate to the best of my knowledge.  Julieanne Manson MD [facility] 12/23/2015 8:26 AM

## 2015-12-23 NOTE — Telephone Encounter (Signed)
This was done during OV-aa

## 2015-12-23 NOTE — Telephone Encounter (Signed)
Pt's husband Jillyn HiddenGary stated that during the OV this morning Dr. Sullivan LoneGilbert advised that he would send in the amoxicillin-clavulanate (AUGMENTIN) 875-125 MG tablet with 1 refill to [facility] b[place]c [place] is [place]. Jillyn HiddenGary request that the refill be sent in today if possible. Thanks TNP

## 2015-12-31 ENCOUNTER — Telehealth: Payer: Self-pay

## 2015-12-31 MED ORDER — DOXYCYCLINE HYCLATE 100 MG PO TABS: 100.0000 mg | ORAL_TABLET | Freq: Two times a day (BID) | ORAL | Status: DC

## 2016-01-27 ENCOUNTER — Telehealth: Payer: Self-pay

## 2016-01-27 NOTE — Telephone Encounter (Signed)
Lmtcb, needs to be seen if having acute issues-aa

## 2016-01-27 NOTE — Telephone Encounter (Signed)
fax from [facility] for Nystatin liquid. I was not sure if you knew what this was about or i can call and let her know to come in for this if its an acute issue. Please review-aa

## 2016-01-27 NOTE — Telephone Encounter (Signed)
No more rf for that

## 2016-03-05 ENCOUNTER — Ambulatory Visit (INDEPENDENT_AMBULATORY_CARE_PROVIDER_SITE_OTHER): Payer: BLUE CROSS/BLUE SHIELD | Admitting: Family Medicine

## 2016-03-05 ENCOUNTER — Encounter: Payer: Self-pay | Admitting: Family Medicine

## 2016-03-05 VITALS — BP 118/74 | HR 87 | Temp 98.4°F | Resp 14 | Wt 180.0 lb

## 2016-03-05 DIAGNOSIS — J0101 Acute recurrent maxillary sinusitis: Secondary | ICD-10-CM

## 2016-03-05 DIAGNOSIS — R1010 Upper abdominal pain, unspecified: Secondary | ICD-10-CM

## 2016-03-05 DIAGNOSIS — G43809 Other migraine, not intractable, without status migrainosus: Secondary | ICD-10-CM

## 2016-03-05 DIAGNOSIS — K219 Gastro-esophageal reflux disease without esophagitis: Secondary | ICD-10-CM

## 2016-03-05 MED ORDER — FLUTICASONE PROPIONATE 50 MCG/ACT NA SUSP: 2.0000 | Freq: Every day | NASAL | Status: DC

## 2016-03-05 MED ORDER — AMOXICILLIN-POT CLAVULANATE 875-125 MG PO TABS
1.0000 | ORAL_TABLET | Freq: Two times a day (BID) | ORAL | Status: DC
Start: 2016-03-05 — End: 2016-09-23

## 2016-03-05 MED ORDER — PANTOPRAZOLE SODIUM 40 MG PO TBEC: 40.0000 mg | DELAYED_RELEASE_TABLET | Freq: Every day | ORAL | Status: DC

## 2016-03-05 MED ORDER — SUCRALFATE 1 G PO TABS: 1.0000 g | ORAL_TABLET | Freq: Three times a day (TID) | ORAL | Status: DC

## 2016-03-05 NOTE — Progress Notes (Signed)
Patient ID: Elizabeth Munoz, female   DOB: [birth year], 50 y.o.   MRN: [id number]    Subjective:  HPI  Patient is here to discuss a few things.  1) Sinus problems: patient developed sinus pressure and pain, headaches, nasal congestion, post nasal drainage, sneezing, itchy eyes, ears feel full for about 1 week now at least. She has used Mucinex for her symptoms. No fever that she knows of and no body aches.  2) migraines: patient was given to use Imitrex as needed and patient states when she takes this medication it hurts her stomach. She has not used anything else for her migraines and has not seen a specialist for this issue. She has migraines almost daily.  3) in July 2016 we sent patient to see Dr. Servando SnareWohl for H pylori and abdominal pain. Patient and husband states it was not a good visit and did not get any help from the doctor. Patient is still having stomach pains. She is not able to eat much because of the pains but somehow still gaining weight and is conserned with that. Dr. Servando SnareWohl tried patient on Dexilant samples but patient states that did not resolve the issue. She is taking Omeprazole 20 mg daily and is still having heartburn and stomach pains. In the past we also gave patient Tagamet and Nystatin.  Prior to Admission medications   Medication Sig Start Date End Date Taking? Authorizing Provider  ALPRAZolam [name](XANAX) 0.5 MG tablet TAKE ONE TABLET BY MOUTH AT BEDTIME AS NEEDED 12/23/15  Yes Richard Hulen ShoutsL Gilbert Jr., MD  furosemide (LASIX) 20 MG tablet Take 0.5 tablets (10 mg total) by mouth daily as needed for fluid. 12/23/15  Yes Richard Hulen ShoutsL Gilbert Jr., MD  gabapentin (NEURONTIN) 600 MG tablet Take 1 tablet (600 mg total) by mouth at bedtime. 12/23/15  Yes Richard Hulen ShoutsL Gilbert Jr., MD  loratadine (CLARITIN) 10 MG tablet Take 1 tablet (10 mg total) by mouth daily. 06/03/15  Yes Richard Hulen ShoutsL Gilbert Jr., MD  Multiple Vitamin (MULTIVITAMIN) tablet Take 1 tablet by mouth daily.   Yes Historical Provider, MD    omeprazole (PRILOSEC) 20 MG capsule Take 1 capsule (20 mg total) by mouth 2 (two) times daily before a meal. 12/23/15  Yes Maple Hudsonichard L Gilbert Jr., MD  sertraline (ZOLOFT) 50 MG tablet Take 1 tablet (50 mg total) by mouth daily. 12/23/15  Yes Richard Hulen ShoutsL Gilbert Jr., MD  SUMAtriptan (IMITREX) 50 MG tablet Take 1 tablet (50 mg total) by mouth every 2 (two) hours as needed for migraine. May repeat in 2 hours if headache persists or recurs. 12/23/15  Yes Richard Hulen ShoutsL Gilbert Jr., MD  traZODone (DESYREL) 50 MG tablet Take 1 tablet (50 mg total) by mouth at bedtime. 12/23/15  Yes Richard Hulen ShoutsL Gilbert Jr., MD  Verapamil HCl CR 200 MG CP24 Take 1 capsule (200 mg total) by mouth daily. 12/23/15  Yes Richard Hulen ShoutsL Gilbert Jr., MD    Patient Active Problem List   Diagnosis Date Noted  . H/O pyloric stenosis   . Menometrorrhagia 06/22/2015  . PMS (premenstrual syndrome) 06/22/2015  . Mood disorder (HCC) 06/22/2015  . Migraine 06/19/2015  . GERD (gastroesophageal reflux disease)     Past Medical History  Diagnosis Date  . Insomnia   . GERD (gastroesophageal reflux disease)   . Depression   . Migraine 06/19/2015  . H/O pyloric stenosis     06/24/2015 noted also by Dr. Sullivan LoneGilbert    Social History   Social History  . Marital Status:  Married    Spouse Name: N/A  . Number of Children: N/A  . Years of Education: N/A   Occupational History  . Not on file.   Social History Main Topics  . Smoking status: Never Smoker   . Smokeless tobacco: Never Used  . Alcohol Use: No  . Drug Use: No  . Sexual Activity: Not Currently   Other Topics Concern  . Not on file   Social History Narrative    No Known Allergies  Review of Systems  Constitutional: Positive for malaise/fatigue.  HENT: Positive for congestion.        Sinus pain and pressure, ears feel full  Eyes:       Itchy eyes  Respiratory: Negative.   Cardiovascular: Negative.   Gastrointestinal: Positive for heartburn and abdominal pain.  Neurological:  Positive for headaches.     Objective:  BP 118/74 mmHg  Pulse 87  Temp(Src) 98.4 F (36.9 C)  Resp 14  Wt 180 lb (81.647 kg)  SpO2 98%  Physical Exam  Constitutional: She is oriented to person, place, and time and well-developed, well-nourished, and in no distress.  HENT:  Head: Normocephalic and atraumatic.  Right Ear: External ear normal.  Left Ear: External ear normal.  Nose: Right sinus exhibits maxillary sinus tenderness. Left sinus exhibits maxillary sinus tenderness.  Mouth/Throat: Oropharynx is clear and moist.  Eyes: Conjunctivae are normal. Pupils are equal, round, and reactive to light.  Neck: Normal range of motion. Neck supple.  Cardiovascular: Normal rate, regular rhythm, normal heart sounds and intact distal pulses.   No murmur heard. Pulmonary/Chest: Effort normal and breath sounds normal. No respiratory distress. She has no wheezes.  Abdominal: Soft. She exhibits no mass. There is no tenderness. There is no rebound and no guarding.  Musculoskeletal: Normal range of motion.  Neurological: She is alert and oriented to person, place, and time.  Skin: Skin is warm and dry.  Psychiatric: Mood, memory, affect and judgment normal.    Lab Results  Component Value Date   WBC 7.0 05/30/2015   HCT 39.6 05/30/2015   PLT 350 05/30/2015   GLUCOSE 71 05/30/2015   TSH 2.390 05/30/2015    CMP     Component Value Date/Time   NA 141 05/30/2015 1243   K 4.8 05/30/2015 1243   CL 102 05/30/2015 1243   CO2 25 05/30/2015 1243   GLUCOSE 71 05/30/2015 1243   BUN 21 05/30/2015 1243   CREATININE 0.65 05/30/2015 1243   CALCIUM 9.6 05/30/2015 1243   PROT 7.2 05/30/2015 1243   ALBUMIN 4.4 05/30/2015 1243   AST 17 05/30/2015 1243   ALT 12 05/30/2015 1243   ALKPHOS 89 05/30/2015 1243   BILITOT <0.2 05/30/2015 1243   GFRNONAA 103 05/30/2015 1243   GFRAA 119 05/30/2015 1243    Assessment and Plan :  1. Acute recurrent maxillary sinusitis Treat today with Augmentin and  Flonase spray. Follow as needed for this. - amoxicillin-clavulanate (AUGMENTIN) 875-125 MG tablet; Take 1 tablet by mouth 2 (two) times daily.  Dispense: 20 tablet; Refill: 0  2. Other type of migraine Will refer to neurologist, this has been an ongoing issue for a while. - Ambulatory referral to Neurology  3. Gastroesophageal reflux disease, esophagitis presence not specified Has severe acid reflux. Will refer to GI-i think patient needs endoscopy at the least in my opinion with on going issue she has had for a year now. Will switch medication-stop Omeprazole, start Protonix and Carafate. - Ambulatory referral to  Gastroenterology - pantoprazole (PROTONIX) 40 MG tablet; Take 1 tablet (40 mg total) by mouth daily.  Dispense: 30 tablet; Refill: 12  4. Upper abdominal pain On going issue refer to GI. - sucralfate (CARAFATE) 1 g tablet; Take 1 tablet (1 g total) by mouth 4 (four) times daily -  with meals and at bedtime.  Dispense: 240 tablet; Refill: 5 - Ambulatory referral to Gastroenterology - pantoprazole (PROTONIX) 40 MG tablet; Take 1 tablet (40 mg total) by mouth daily.  Dispense: 30 tablet; Refill: 12 5. Chronic anxiety/possible mild depression Patient was seen and examined by Dr. Bosie Clos and note was scribed by Samara Deist, RMA.  Julieanne Manson MD [facility] 03/05/2016 9:24 AM

## 2016-03-06 ENCOUNTER — Ambulatory Visit: Payer: BLUE CROSS/BLUE SHIELD | Admitting: Family Medicine

## 2016-03-10 ENCOUNTER — Telehealth: Payer: Self-pay | Admitting: Family Medicine

## 2016-03-10 NOTE — Telephone Encounter (Signed)
Advised patient as below. Called in Rx into pharmacy.

## 2016-03-10 NOTE — Telephone Encounter (Signed)
Pt states that the Sumatriptan that was prescribed for her gives her stomach issues.Can a different medication be sent into [name] for her headaches ?

## 2016-03-10 NOTE — Telephone Encounter (Signed)
Rx completed

## 2016-03-10 NOTE — Telephone Encounter (Signed)
Please review. Thanks!  

## 2016-03-10 NOTE — Telephone Encounter (Signed)
Try generic Fioricet, 1-2 every 4 hours when necessary headache #35, 2 refills.neurology referral should be pending.

## 2016-04-15 ENCOUNTER — Telehealth: Payer: Self-pay | Admitting: Obstetrics and Gynecology

## 2016-04-15 NOTE — Telephone Encounter (Signed)
Pt walked in and was very unhappy with the cost of her visit when she came for BX results. She said you were in the room 5 mins  To tell her that her results were negative. She was a self pay and her bill was adjusted, but she said she never paid more than A$75.00 at her primary dr. She wants for you top call her.

## 2016-04-15 NOTE — Telephone Encounter (Signed)
I am certain that more than 5 min was spent with the patient at each visit, not only due to the language barrier, but also because we discussed next steps for her heavy periods at every visit.  Patient was deciding between hysterectomy and getting an IUD, and I have even had her speak with Adella NissenKristal Cox on several occasions regarding the cost of each.  But I will contact her.

## 2016-04-16 NOTE — Telephone Encounter (Signed)
i am sorry dr Valentino Saxoncherry. She spent 15 or more mins at my window and kept insisting that we were wrong. Even said she wasn't here and got charged. Kristal cam over and explained to her too. We told her she was here on 07/2015 date. We got her BP and vitals recorded and you close a note on the visit. That's when she changed her tune and said you only came in for 5 mins and told her that her results were negative. i bet we can see how long she was here. Ill let you know

## 2016-04-16 NOTE — Telephone Encounter (Signed)
Contacted patient regarding her concerns with billing.  Patient explains that she was a self-pay patient during those visits, and has been receiving numerous bills for which she feels are too high.  Was undergoing workkup for perimenopausal menorrhagia and was planning for a hysterectomy at that time. Was last seen in 07/2015. Currently now has insurance and is wondering if this could be billed to her insurance.  I explained that this was not likely as the insurance companies are not likely to pay for retrograde visits prior to start date.  However had also previously mentioned to patient during her prior visits [facility] during that time of her being uninsured.  Discussed that patient could be placed on a self-pay discount and have a payment plan set up.  Notes that she has made an appointment in June.  Instructed to bring her bills for her previous visits and we would go over them with our financial/billing personnel.    Hildred LaserAnika Tahj Lindseth, MD [facility]

## 2016-06-01 ENCOUNTER — Ambulatory Visit: Payer: BLUE CROSS/BLUE SHIELD | Admitting: Obstetrics and Gynecology

## 2016-06-02 ENCOUNTER — Other Ambulatory Visit: Payer: Self-pay | Admitting: Family Medicine

## 2016-06-14 ENCOUNTER — Other Ambulatory Visit: Payer: Self-pay | Admitting: Unknown Physician Specialty

## 2016-06-14 DIAGNOSIS — R519 Headache, unspecified: Secondary | ICD-10-CM

## 2016-06-14 DIAGNOSIS — R51 Headache: Principal | ICD-10-CM

## 2016-06-14 DIAGNOSIS — J0131 Acute recurrent sphenoidal sinusitis: Secondary | ICD-10-CM

## 2016-06-15 ENCOUNTER — Other Ambulatory Visit: Payer: Self-pay | Admitting: Gastroenterology

## 2016-06-15 ENCOUNTER — Ambulatory Visit: Payer: BLUE CROSS/BLUE SHIELD | Admitting: Obstetrics and Gynecology

## 2016-06-15 DIAGNOSIS — R1013 Epigastric pain: Secondary | ICD-10-CM

## 2016-06-15 DIAGNOSIS — R110 Nausea: Secondary | ICD-10-CM

## 2016-06-21 ENCOUNTER — Ambulatory Visit
Admission: RE | Admit: 2016-06-21 | Discharge: 2016-06-21 | Disposition: A | Discharge status: Home / Self Care | Payer: BLUE CROSS/BLUE SHIELD | Source: Ambulatory Visit | Attending: Gastroenterology | Admitting: Gastroenterology

## 2016-06-21 DIAGNOSIS — R110 Nausea: Secondary | ICD-10-CM | POA: Insufficient documentation

## 2016-06-21 DIAGNOSIS — R1013 Epigastric pain: Secondary | ICD-10-CM | POA: Diagnosis present

## 2016-06-30 ENCOUNTER — Other Ambulatory Visit: Payer: Self-pay | Admitting: Emergency Medicine

## 2016-06-30 DIAGNOSIS — G479 Sleep disorder, unspecified: Secondary | ICD-10-CM

## 2016-06-30 MED ORDER — ALPRAZOLAM 0.5 MG PO TABS: 0.5000 mg | ORAL_TABLET | Freq: Every evening | ORAL | Status: DC | PRN

## 2016-06-30 NOTE — Telephone Encounter (Signed)
Pharmacy requesting refill for pt. Alprazolam 0.5 mg qhs PRN. She is a [facility] pt. Thanks.

## 2016-06-30 NOTE — Telephone Encounter (Signed)
Medication was called in to the pharmacy.  

## 2016-07-05 ENCOUNTER — Ambulatory Visit
Admission: RE | Admit: 2016-07-05 | Discharge: 2016-07-05 | Disposition: A | Discharge status: Home / Self Care | Payer: BLUE CROSS/BLUE SHIELD | Source: Ambulatory Visit | Attending: Unknown Physician Specialty | Admitting: Unknown Physician Specialty

## 2016-07-05 DIAGNOSIS — R51 Headache: Secondary | ICD-10-CM | POA: Diagnosis present

## 2016-07-05 DIAGNOSIS — J0131 Acute recurrent sphenoidal sinusitis: Secondary | ICD-10-CM | POA: Insufficient documentation

## 2016-07-05 DIAGNOSIS — R519 Headache, unspecified: Secondary | ICD-10-CM

## 2016-07-05 MED ORDER — GADOBENATE DIMEGLUMINE 529 MG/ML IV SOLN
15.0000 mL | Freq: Once | INTRAVENOUS | Status: AC | PRN
Start: 2016-07-05 — End: 2016-07-05
  Administered 2016-07-05: 15 mL via INTRAVENOUS

## 2016-07-08 ENCOUNTER — Encounter: Payer: Self-pay | Admitting: Family Medicine

## 2016-07-12 ENCOUNTER — Other Ambulatory Visit: Payer: Self-pay | Admitting: Gastroenterology

## 2016-07-12 DIAGNOSIS — K219 Gastro-esophageal reflux disease without esophagitis: Secondary | ICD-10-CM

## 2016-07-12 DIAGNOSIS — R1013 Epigastric pain: Secondary | ICD-10-CM

## 2016-07-23 ENCOUNTER — Encounter: Admission: RE | Admit: 2016-07-23 | Payer: BLUE CROSS/BLUE SHIELD | Source: Ambulatory Visit

## 2016-09-23 ENCOUNTER — Ambulatory Visit (INDEPENDENT_AMBULATORY_CARE_PROVIDER_SITE_OTHER): Payer: BLUE CROSS/BLUE SHIELD | Admitting: Family Medicine

## 2016-09-23 VITALS — BP 118/82 | HR 92 | Temp 98.5°F | Resp 16 | Wt 181.0 lb

## 2016-09-23 DIAGNOSIS — F3289 Other specified depressive episodes: Secondary | ICD-10-CM

## 2016-09-23 DIAGNOSIS — K219 Gastro-esophageal reflux disease without esophagitis: Secondary | ICD-10-CM | POA: Diagnosis not present

## 2016-09-23 DIAGNOSIS — G43909 Migraine, unspecified, not intractable, without status migrainosus: Secondary | ICD-10-CM

## 2016-09-23 DIAGNOSIS — J0101 Acute recurrent maxillary sinusitis: Secondary | ICD-10-CM | POA: Diagnosis not present

## 2016-09-23 DIAGNOSIS — G4709 Other insomnia: Secondary | ICD-10-CM

## 2016-09-23 MED ORDER — AMOXICILLIN-POT CLAVULANATE 875-125 MG PO TABS: 1.0000 | ORAL_TABLET | Freq: Two times a day (BID) | ORAL | 0 refills | Status: DC

## 2016-09-23 MED ORDER — TEMAZEPAM 30 MG PO CAPS: 30.0000 mg | ORAL_CAPSULE | Freq: Every evening | ORAL | 3 refills | Status: DC | PRN
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
## 2016-09-24 ENCOUNTER — Telehealth: Payer: Self-pay | Admitting: Family Medicine

## 2016-09-24 NOTE — Telephone Encounter (Signed)
Rx was printed for the patient and left up front for pick up.

## 2016-09-24 NOTE — Telephone Encounter (Signed)
Pt's husband called saying his wife was in yesterday and seen Dr. Sullivan LoneGilbert.  She prescribed her antibiotic and they thought a sleep medication.  When he went to pick up the prescriptions only the antibiotic was there.    [facility].  Please call pat regarding the sleep medication.  Pt's call back [phone]  Thanks Barth Kirkseri

## 2016-10-13 ENCOUNTER — Ambulatory Visit (INDEPENDENT_AMBULATORY_CARE_PROVIDER_SITE_OTHER): Payer: BLUE CROSS/BLUE SHIELD | Admitting: Family Medicine

## 2016-10-13 ENCOUNTER — Encounter: Payer: Self-pay | Admitting: Family Medicine

## 2016-10-13 VITALS — BP 116/80 | HR 88 | Temp 98.3°F | Resp 16

## 2016-10-13 DIAGNOSIS — G479 Sleep disorder, unspecified: Secondary | ICD-10-CM | POA: Diagnosis not present

## 2016-10-13 DIAGNOSIS — F4323 Adjustment disorder with mixed anxiety and depressed mood: Secondary | ICD-10-CM | POA: Diagnosis not present

## 2016-10-13 DIAGNOSIS — F5102 Adjustment insomnia: Secondary | ICD-10-CM

## 2016-10-13 DIAGNOSIS — H66003 Acute suppurative otitis media without spontaneous rupture of ear drum, bilateral: Secondary | ICD-10-CM | POA: Diagnosis not present

## 2016-10-13 DIAGNOSIS — F4320 Adjustment disorder, unspecified: Secondary | ICD-10-CM | POA: Insufficient documentation

## 2016-10-13 MED ORDER — TRAZODONE HCL 50 MG PO TABS: 50.0000 mg | ORAL_TABLET | Freq: Every day | ORAL | 12 refills | Status: DC

## 2016-10-13 MED ORDER — ALPRAZOLAM 0.5 MG PO TABS: 0.5000 mg | ORAL_TABLET | Freq: Every evening | ORAL | 5 refills | Status: DC | PRN

## 2016-10-13 MED ORDER — AMOXICILLIN-POT CLAVULANATE 875-125 MG PO TABS: 1.0000 | ORAL_TABLET | Freq: Two times a day (BID) | ORAL | 0 refills | Status: DC

## 2016-10-13 MED ORDER — SERTRALINE HCL 100 MG PO TABS: 100.0000 mg | ORAL_TABLET | Freq: Every day | ORAL | 3 refills | Status: DC
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
## 2016-10-22 ENCOUNTER — Other Ambulatory Visit: Payer: Self-pay | Admitting: Family Medicine

## 2016-11-26 ENCOUNTER — Ambulatory Visit: Payer: BLUE CROSS/BLUE SHIELD | Admitting: Family Medicine

## 2016-12-06 ENCOUNTER — Ambulatory Visit (INDEPENDENT_AMBULATORY_CARE_PROVIDER_SITE_OTHER): Payer: BLUE CROSS/BLUE SHIELD | Admitting: Family Medicine

## 2016-12-06 DIAGNOSIS — Z23 Encounter for immunization: Secondary | ICD-10-CM

## 2016-12-06 NOTE — Progress Notes (Signed)
For flu shot only. 

## 2017-01-11 ENCOUNTER — Other Ambulatory Visit: Payer: Self-pay

## 2017-01-11 MED ORDER — GABAPENTIN 600 MG PO TABS: 600.0000 mg | ORAL_TABLET | Freq: Every day | ORAL | 3 refills | Status: DC

## 2017-01-11 NOTE — Telephone Encounter (Signed)
[facility]-aa

## 2017-02-28 ENCOUNTER — Ambulatory Visit: Payer: BLUE CROSS/BLUE SHIELD | Admitting: Family Medicine

## 2017-02-28 ENCOUNTER — Other Ambulatory Visit: Payer: Self-pay | Admitting: Family Medicine

## 2017-02-28 MED ORDER — LORATADINE 10 MG PO TABS: ORAL_TABLET | ORAL | 12 refills | Status: DC

## 2017-02-28 NOTE — Telephone Encounter (Signed)
Pt contacted office for refill request on the following medications:  loratadine (CLARITIN) 10 MG tablet.  Elizabeth [name].  CB#[phone]/MW

## 2017-03-04 ENCOUNTER — Other Ambulatory Visit: Payer: Self-pay | Admitting: Family Medicine

## 2017-03-04 DIAGNOSIS — F329 Major depressive disorder, single episode, unspecified: Secondary | ICD-10-CM

## 2017-03-04 DIAGNOSIS — F32A Depression, unspecified: Secondary | ICD-10-CM

## 2017-04-04 ENCOUNTER — Ambulatory Visit (INDEPENDENT_AMBULATORY_CARE_PROVIDER_SITE_OTHER): Payer: BLUE CROSS/BLUE SHIELD | Admitting: Family Medicine

## 2017-04-04 ENCOUNTER — Encounter: Payer: Self-pay | Admitting: Family Medicine

## 2017-04-04 VITALS — BP 110/80 | HR 92 | Temp 98.8°F | Resp 16 | Wt 176.0 lb

## 2017-04-04 DIAGNOSIS — J301 Allergic rhinitis due to pollen: Secondary | ICD-10-CM | POA: Diagnosis not present

## 2017-04-04 DIAGNOSIS — L209 Atopic dermatitis, unspecified: Secondary | ICD-10-CM | POA: Diagnosis not present

## 2017-04-04 MED ORDER — PREDNISONE 20 MG PO TABS: ORAL_TABLET | ORAL | 0 refills | Status: DC

## 2017-04-04 NOTE — Progress Notes (Addendum)
Subjective:     Patient ID: Elizabeth Munoz, female   DOB: 09-19-1966, 51 y.o.   MRN: [id number]  HPI  Chief Complaint  Patient presents with  . Allergies    Patient comes in office today to address seasonal allergies, patient has been taking prescription Loratadine and Flonase with no relief. Patient complains of itchy throat, and itchy/watery eyes.   . Skin Problem    Patient would like to address cracking of skin around mouth and eyes for the past 30 days. Patient reports in past she has seen Dr. Gwen Pounds who gave her a cream to help with skin. Patient reports that around her eyes and mouth is the only place where skin is cracking and burning, she states that she has had swelling around lower eye lid from touching and difficulty opening eye (right eye) in the AM.   Patient was unsure of what her prior dermatitis was or what cream per Dr. Gwen Pounds was provided. History appears to be c/w atopic dermatitis and poorly controlled seasonal allergies. Was unable to get in with her primary M.D.or with Dr. Gwen Pounds. There is a mild language barrier.  Review of Systems     Objective:   Physical Exam  Constitutional: She appears well-developed and well-nourished. No distress.  HENT:  Minimal swelling of upper eyelids.No drainage or scleral injection noted.  Skin:  Right facial rash covered by makeup.       Assessment:    1. Atopic dermatitis, unspecified type - predniSONE (DELTASONE) 20 MG tablet; Take one pill twice daily  Dispense: 14 tablet; Refill: 0 - Ambulatory referral to Dermatology  2. Seasonal allergic rhinitis due to pollen - predniSONE (DELTASONE) 20 MG tablet; Take one pill twice daily  Dispense: 14 tablet; Refill: 0    Plan:%/.025    Will follow her up with her primary M.D, Dr. Sullivan Lone, as well. Addendum: requested med list from dermatology received 04/05/17: Med list from 11/26/15 and 11/06/15 respectively listed adapalene .3% topical gel apply daily and Veltin 1.2%/.025%  topical gel applied at bedtime for acne.

## 2017-04-04 NOTE — Patient Instructions (Addendum)
We will call you about the referral to dermatology. 

## 2017-04-12 ENCOUNTER — Other Ambulatory Visit: Payer: Self-pay | Admitting: Family Medicine

## 2017-04-12 DIAGNOSIS — G479 Sleep disorder, unspecified: Secondary | ICD-10-CM

## 2017-04-13 ENCOUNTER — Telehealth: Payer: Self-pay | Admitting: Family Medicine

## 2017-04-13 DIAGNOSIS — F4323 Adjustment disorder with mixed anxiety and depressed mood: Secondary | ICD-10-CM

## 2017-04-13 NOTE — Telephone Encounter (Signed)
Jody from [facility] called about pt's Zoloft.  He wants to know if  just they can do the 50 mg and dose it out so she is not having to get two orders of the same drug.  Right now she is getting 2 different rxs of Zoloft.  His call back is [phone]  Thanks teri

## 2017-04-14 NOTE — Telephone Encounter (Signed)
Dr Sullivan Lone I wanted to check with you, last office note in October mentions increasing Zoloft to 100 mg not 150 mg. I wanted to make sure this was accurate and not missing information about the correct dose.-aa

## 2017-04-15 MED ORDER — SERTRALINE HCL 100 MG PO TABS: 100.0000 mg | ORAL_TABLET | Freq: Every day | ORAL | 3 refills | Status: DC

## 2017-04-15 NOTE — Telephone Encounter (Signed)
Notes indicate it should be  daily.

## 2017-04-15 NOTE — Telephone Encounter (Signed)
RX sent in for 100 mg, pharmacist advised per notes on the refill-aa

## 2017-05-03 ENCOUNTER — Other Ambulatory Visit: Payer: Self-pay | Admitting: Family Medicine

## 2017-05-03 ENCOUNTER — Other Ambulatory Visit: Payer: Self-pay | Admitting: Emergency Medicine

## 2017-05-03 DIAGNOSIS — R1010 Upper abdominal pain, unspecified: Secondary | ICD-10-CM

## 2017-05-03 DIAGNOSIS — A048 Other specified bacterial intestinal infections: Secondary | ICD-10-CM

## 2017-05-03 MED ORDER — OMEPRAZOLE 20 MG PO CPDR: 20.0000 mg | DELAYED_RELEASE_CAPSULE | Freq: Two times a day (BID) | ORAL | 12 refills | Status: DC

## 2017-05-03 NOTE — Telephone Encounter (Signed)
[facility] for [facility] following medication. Thanks CC  omeprazole (PRILOSEC) 20 MG capsule Take 1 capsule by mouth twice a day before a meal.

## 2017-05-03 NOTE — Progress Notes (Signed)
rx was printed instead of E prescribed.

## 2017-05-18 ENCOUNTER — Other Ambulatory Visit: Payer: Self-pay | Admitting: Family Medicine

## 2017-05-18 NOTE — Telephone Encounter (Signed)
Faxed Rx for Temazepam to [facility] @ [phone]. Thanks TNP

## 2017-05-20 ENCOUNTER — Other Ambulatory Visit: Payer: Self-pay

## 2017-05-20 ENCOUNTER — Other Ambulatory Visit: Payer: Self-pay | Admitting: Family Medicine

## 2017-05-20 DIAGNOSIS — J301 Allergic rhinitis due to pollen: Secondary | ICD-10-CM

## 2017-05-20 DIAGNOSIS — L209 Atopic dermatitis, unspecified: Secondary | ICD-10-CM

## 2017-05-20 MED ORDER — PREDNISONE 20 MG PO TABS: ORAL_TABLET | ORAL | 0 refills | Status: DC

## 2017-06-06 ENCOUNTER — Ambulatory Visit (INDEPENDENT_AMBULATORY_CARE_PROVIDER_SITE_OTHER): Payer: BLUE CROSS/BLUE SHIELD | Admitting: Family Medicine

## 2017-06-06 VITALS — BP 114/82 | HR 78 | Resp 14 | Wt 180.0 lb

## 2017-06-06 DIAGNOSIS — A048 Other specified bacterial intestinal infections: Secondary | ICD-10-CM

## 2017-06-06 DIAGNOSIS — J301 Allergic rhinitis due to pollen: Secondary | ICD-10-CM | POA: Diagnosis not present

## 2017-06-06 DIAGNOSIS — F4323 Adjustment disorder with mixed anxiety and depressed mood: Secondary | ICD-10-CM | POA: Diagnosis not present

## 2017-06-06 DIAGNOSIS — K219 Gastro-esophageal reflux disease without esophagitis: Secondary | ICD-10-CM | POA: Diagnosis not present

## 2017-06-06 DIAGNOSIS — G43909 Migraine, unspecified, not intractable, without status migrainosus: Secondary | ICD-10-CM

## 2017-06-06 DIAGNOSIS — Z6830 Body mass index (BMI) 30.0-30.9, adult: Secondary | ICD-10-CM | POA: Diagnosis not present

## 2017-06-06 DIAGNOSIS — G4709 Other insomnia: Secondary | ICD-10-CM

## 2017-06-06 MED ORDER — OMEPRAZOLE 20 MG PO CPDR: 20.0000 mg | DELAYED_RELEASE_CAPSULE | Freq: Two times a day (BID) | ORAL | 12 refills | Status: DC

## 2017-06-06 NOTE — Progress Notes (Signed)
Elizabeth Munoz  MRN: [id number] DOB: 29-May-1966  Subjective:  HPI  Patient is here for follow up on medication and overall. Last office visit with Dr Sullivan LoneGilbert for follow up was on 10/13/16. Patient does not have medication list just her tablets. Not sure 100% what patient is taking. She also would like to have a lump on her shoulder left side looked at. Wt Readings from Last 3 Encounters:  06/06/17 180 lb (81.6 kg)  04/04/17 176 lb (79.8 kg)  09/23/16 181 lb (82.1 kg)    Patient Active Problem List   Diagnosis Date Noted  . 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 H/O pyloric stenosis   . Menometrorrhagia 06/22/2015  . PMS (premenstrual syndrome) 06/22/2015  . Mood disorder (HCC) 06/22/2015  . Migraine 06/19/2015  . GERD (gastroesophageal reflux disease)     Past Medical History:  Diagnosis Date  . Depression   . GERD (gastroesophageal reflux disease)   . H/O pyloric stenosis    06/24/2015 noted also by Dr. Sullivan LoneGilbert  . Insomnia   . Migraine 06/19/2015    Social History   Social History  . Marital status: Single    Spouse name: N/A  . Number of children: N/A  . Years of education: N/A   Occupational History  . Not on file.   Social History Main Topics  . Smoking status: Never Smoker  . Smokeless tobacco: Never Used  . Alcohol use No  . Drug use: No  . Sexual activity: Not Currently   Other Topics Concern  . Not on file   Social History Narrative  . No narrative on file    Outpatient Encounter Prescriptions as of 06/06/2017  Medication Sig Note  . ALPRAZolam (XANAX) 0.5 MG tablet TAKE ONE TABLET BY MOUTH AT BEDTIME AS NEEDED   . butalbital-acetaminophen-caffeine (FIORICET, ESGIC) 50-325-40 MG tablet TAKE ONE TO TWO TABLETS EVERY FOUR HOURSAS NEEDED FOR HEADACHE 06/06/2017: As needed  . fluticasone (FLONASE) 50 MCG/ACT nasal spray Place 2 sprays into both nostrils daily.   Marland Kitchen. gabapentin (NEURONTIN) 600 MG tablet Take 1 tablet (600 mg total) by mouth at  bedtime.   Marland Kitchen. loratadine (CLARITIN) 10 MG tablet TAKE ONE (1) TABLET BY MOUTH EVERY DAY   . Multiple Vitamin (MULTIVITAMIN) tablet Take 1 tablet by mouth daily.   Marland Kitchen. omeprazole (PRILOSEC) 20 MG capsule Take 1 capsule (20 mg total) by mouth 2 (two) times daily before a meal.   . RABEprazole (ACIPHEX) 20 MG tablet Take 20 mg by mouth 2 (two) times daily.   . sertraline (ZOLOFT) 100 MG tablet Take 1 tablet (100 mg total) by mouth daily.   . sucralfate (CARAFATE) 1 g tablet TAKE ONE (1) TABLET FOUR (4) TIMES PER DAY -WITH MEALS AND AT BEDTIME   . temazepam (RESTORIL) 30 MG capsule TAKE ONE CAPSULE AT BEDTIME AS NEEDED FOR SLEEP   . traZODone (DESYREL) 50 MG tablet Take 1 tablet (50 mg total) by mouth at bedtime.   . [DISCONTINUED] dexlansoprazole (DEXILANT) 60 MG capsule Take 60 mg by mouth daily.   . [DISCONTINUED] furosemide (LASIX) 20 MG tablet Take 0.5 tablets (10 mg total) by mouth daily as needed for fluid.   . [DISCONTINUED] pantoprazole (PROTONIX) 40 MG tablet Take 1 tablet (40 mg total) by mouth daily.   . [DISCONTINUED] predniSONE (DELTASONE) 20 MG tablet Take one pill twice daily   . [DISCONTINUED] Verapamil HCl CR 200 MG CP24 Take 1 capsule (200 mg total) by mouth daily.    No facility-administered  encounter medications on file as of 06/06/2017.     No Known Allergies  Review of Systems  Constitutional: Negative.        Weight gain  HENT: Positive for congestion.        Hoarse voice  Respiratory: Negative.   Cardiovascular: Negative.   Gastrointestinal: Positive for heartburn.  Musculoskeletal: Positive for joint pain (leg pain).       Lump left shoulder  Neurological: Negative.   Psychiatric/Behavioral: The patient is nervous/anxious and has insomnia.     Objective:  BP 114/82   Pulse 78   Resp 14   Wt 180 lb (81.6 kg)   BMI 30.90 kg/m   Physical Exam  Constitutional: She is oriented to person, place, and time and well-developed, well-nourished, and in no distress.    HENT:  Head: Normocephalic and atraumatic.  Neck: Normal range of motion. Neck supple. No thyromegaly present.  Cardiovascular: Normal rate, regular rhythm, normal heart sounds and intact distal pulses.  Exam reveals no gallop.   No murmur heard. Pulmonary/Chest: Effort normal and breath sounds normal. No respiratory distress. She has no wheezes.  Musculoskeletal: She exhibits no edema or tenderness.  Neurological: She is alert and oriented to person, place, and time.  Psychiatric: Mood, memory, affect and judgment normal.   Assessment and Plan :  1. Seasonal allergic rhinitis due to pollen Stable.  2. Other insomnia Unsure which medications patient is actually taking. Advised patient to bring in her medications/actual bottles in next time t verify her medications.  3. Gastroesophageal reflux disease, esophagitis presence not specified Advised patient to continue Carafate and Omeprazole twice daily. Stop Aciphex.Follow. Advised patient to work on habits.  4. Migraine without status migrainosus, not intractable, unspecified migraine type  5. Adjustment disorder with mixed anxiety and depressed mood  HPI, Exam and A&P transcribed by Samara Deist, RMA under direction and in the presence of Julieanne Manson, MD. I have done the exam and reviewed the chart and it is accurate to the best of my knowledge. Dentist has been used and  any errors in dictation or transcription are unintentional. Julieanne Manson M.D. [facility]

## 2017-06-06 NOTE — Patient Instructions (Addendum)
Stop taking Aciphex and continue Carafate and Omeprazole but at twice daily.

## 2017-06-07 ENCOUNTER — Telehealth: Payer: Self-pay | Admitting: Family Medicine

## 2017-06-07 DIAGNOSIS — G43119 Migraine with aura, intractable, without status migrainosus: Secondary | ICD-10-CM

## 2017-06-07 MED ORDER — CETIRIZINE HCL 10 MG PO TABS: 10.0000 mg | ORAL_TABLET | Freq: Every day | ORAL | 11 refills | Status: DC

## 2017-06-07 MED ORDER — POLYETHYLENE GLYCOL 3350 17 GM/SCOOP PO POWD: 17.0000 g | Freq: Every day | ORAL | 1 refills | Status: DC

## 2017-06-07 MED ORDER — VERAPAMIL HCL ER 200 MG PO CP24: 1.0000 | ORAL_CAPSULE | Freq: Every day | ORAL | 12 refills | Status: DC

## 2017-06-07 NOTE — Telephone Encounter (Signed)
Pt is requesting a call back to discuss her medication list.  Pt also want to advise she is still having a headache and the light is hurting her eyes.  Pt is requesting a Rx to help with this.  Elizabeth [name].  CB#[phone]/MW

## 2017-06-07 NOTE — Telephone Encounter (Signed)
Spoke with patient about her medications and confirmed what she has been taking per bottles of medications she has at home. Medication list updated and patient was advised to stop Omeprazole and Pantoprazole and just take Aciphex BID, continue Carafate. Stop Temazepam, Stop Loratadine. Try Zyrtec instead since patient did not feel as Loratadine was helping as well with allergy symptoms anymore.  Patient is taking Verapamil daily which at first she said for stomach pain she was told to take but then realized this was wrong information and said she was put on this medication for b/p when it was going up some and thought that b/p could be contributing to the migraines patient was having. She will continue this medication and check her b/p-may need to stop in the future. Refill for Verapamil sent in. Patient states her migraine is worse then yesterday and she is having aura, she was not able to sleep well last night due to this. She has been taking Fioricet and Advil, has taking some Excedrin. After speaking to Dr Sullivan LoneGilbert patient was advised since the medications she has been taking not helping and she did not tolerate Imitrex will refer to neurologist for further work up. Order put in and patient agreed with the plan. I called [facility] and advised pharmacist of all the changes and updated medications with him to have on their file.- AA

## 2017-06-07 NOTE — Telephone Encounter (Signed)
Taking care of this-aa 

## 2017-06-16 ENCOUNTER — Other Ambulatory Visit: Payer: Self-pay | Admitting: Family Medicine

## 2017-06-16 DIAGNOSIS — G479 Sleep disorder, unspecified: Secondary | ICD-10-CM

## 2017-07-25 ENCOUNTER — Ambulatory Visit: Payer: BLUE CROSS/BLUE SHIELD | Admitting: Family Medicine

## 2017-08-09 ENCOUNTER — Other Ambulatory Visit: Payer: Self-pay

## 2017-08-15 ENCOUNTER — Telehealth: Payer: Self-pay | Admitting: Family Medicine

## 2017-08-15 DIAGNOSIS — F5102 Adjustment insomnia: Secondary | ICD-10-CM

## 2017-08-15 NOTE — Telephone Encounter (Signed)
Please review-aa 

## 2017-08-15 NOTE — Telephone Encounter (Signed)
Pt states the Rx traZODone (DESYREL) 50 MG tablet is not working and pt is requesting to start back taking Temazetam 30MG .  [facility].  CB#[phone]/MW

## 2017-08-18 MED ORDER — TRAZODONE HCL 100 MG PO TABS: 100.0000 mg | ORAL_TABLET | Freq: Every day | ORAL | 12 refills | Status: DC

## 2017-08-18 MED ORDER — POLYETHYLENE GLYCOL 3350 17 GM/SCOOP PO POWD: 17.0000 g | Freq: Every day | ORAL | 5 refills | Status: DC

## 2017-08-18 NOTE — Telephone Encounter (Signed)
OK to rf glycolax.

## 2017-08-18 NOTE — Telephone Encounter (Signed)
Patient advised and RX for 100 mg sent in. Patient also wanted to ask if you would be willing to refill her Glycolax/miralax. Her GI doctor (Dr Shelle Ironein) is hard to get into because he does more work at hospital now and his NP stays booked due to not having enough doctors there. They told her they can not refill the medication until she is seen but she has called several times and can not get in because no appointments are available. They told her to take OTC type and she did but the flavor gave her nausea and she almost vomited. She wanted to see if you would be comfortable with feeling this for her. She is out. Thank you-aa

## 2017-08-18 NOTE — Telephone Encounter (Signed)
RX sent in, pt advised on her voicemail per patient's request-aa

## 2017-08-18 NOTE — Telephone Encounter (Signed)
Safer option to increase trazedone to 100mg  daily.

## 2017-09-01 ENCOUNTER — Ambulatory Visit (INDEPENDENT_AMBULATORY_CARE_PROVIDER_SITE_OTHER): Payer: BLUE CROSS/BLUE SHIELD | Admitting: Family Medicine

## 2017-09-01 VITALS — BP 122/82 | HR 97 | Temp 98.3°F | Resp 16 | Wt 181.6 lb

## 2017-09-01 DIAGNOSIS — R059 Cough, unspecified: Secondary | ICD-10-CM

## 2017-09-01 DIAGNOSIS — J069 Acute upper respiratory infection, unspecified: Secondary | ICD-10-CM

## 2017-09-01 DIAGNOSIS — R05 Cough: Secondary | ICD-10-CM | POA: Diagnosis not present

## 2017-09-01 DIAGNOSIS — J329 Chronic sinusitis, unspecified: Secondary | ICD-10-CM

## 2017-09-01 MED ORDER — POLYETHYLENE GLYCOL 3350 17 GM/SCOOP PO POWD: 17.0000 g | Freq: Every day | ORAL | 5 refills | Status: DC

## 2017-09-01 MED ORDER — AZITHROMYCIN 250 MG PO TABS: ORAL_TABLET | ORAL | 0 refills | Status: DC

## 2017-09-01 NOTE — Progress Notes (Signed)
Elizabeth Munoz  MRN: [id number] DOB: 1966/03/07  Subjective:  HPI   Patient started to feel bad on Saturday 08/27/17. Then developed headache on the right side of the face, right eye pain around sinuses, sore throat really bad on Tuesday this week, ear pain, dry cough, feel hot, nasal congestion. Has used Mucinex and Flonase nasal spray.   Patient Active Problem List   Diagnosis Date Noted  . 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 H/O pyloric stenosis   . Menometrorrhagia 06/22/2015  . PMS (premenstrual syndrome) 06/22/2015  . Mood disorder (HCC) 06/22/2015  . Migraine 06/19/2015  . GERD (gastroesophageal reflux disease)     Past Medical History:  Diagnosis Date  . Depression   . GERD (gastroesophageal reflux disease)   . H/O pyloric stenosis    06/24/2015 noted also by Dr. Sullivan LoneGilbert  . Insomnia   . Migraine 06/19/2015    Social History   Social History  . Marital status: Single    Spouse name: N/A  . Number of children: N/A  . Years of education: N/A   Occupational History  . Not on file.   Social History Main Topics  . Smoking status: Never Smoker  . Smokeless tobacco: Never Used  . Alcohol use No  . Drug use: No  . Sexual activity: Not Currently   Other Topics Concern  . Not on file   Social History Narrative  . No narrative on file    Outpatient Encounter Prescriptions as of 09/01/2017  Medication Sig Note  . ALPRAZolam (XANAX) 0.5 MG tablet TAKE 1 TABLET AT BEDTIMES AS NEEDED   . butalbital-acetaminophen-caffeine (FIORICET, ESGIC) 50-325-40 MG tablet TAKE ONE TO TWO TABLETS EVERY FOUR HOURSAS NEEDED FOR HEADACHE 06/06/2017: As needed  . cetirizine (ZYRTEC) 10 MG tablet Take 1 tablet (10 mg total) by mouth daily.   . fluticasone (FLONASE) 50 MCG/ACT nasal spray Place 2 sprays into both nostrils daily.   Marland Kitchen. gabapentin (NEURONTIN) 600 MG tablet Take 1 tablet (600 mg total) by mouth at bedtime.   . Multiple Vitamin (MULTIVITAMIN) tablet Take 1 tablet by mouth  daily.   . polyethylene glycol powder (GLYCOLAX/MIRALAX) powder Take 17 g by mouth daily.   . RABEprazole (ACIPHEX) 20 MG tablet Take 20 mg by mouth 2 (two) times daily.   . sertraline (ZOLOFT) 100 MG tablet Take 1 tablet (100 mg total) by mouth daily.   . sucralfate (CARAFATE) 1 g tablet TAKE ONE (1) TABLET FOUR (4) TIMES PER DAY -WITH MEALS AND AT BEDTIME   . traZODone (DESYREL) 100 MG tablet Take 1 tablet (100 mg total) by mouth at bedtime.   . Verapamil HCl CR 200 MG CP24 Take 1 capsule (200 mg total) by mouth daily.   . [DISCONTINUED] polyethylene glycol powder (GLYCOLAX/MIRALAX) powder Take 17 g by mouth daily.    No facility-administered encounter medications on file as of 09/01/2017.     Allergies  Allergen Reactions  . Imitrex [Sumatriptan]     Stomach pain, nausea, vomiting, felt bad    Review of Systems  Constitutional: Positive for malaise/fatigue.  HENT: Positive for congestion, ear pain, sinus pain and sore throat.   Respiratory: Positive for cough. Negative for sputum production and shortness of breath.   Cardiovascular: Negative.   Musculoskeletal: Negative.   Neurological: Positive for weakness and headaches.    Objective:  BP 122/82   Pulse 97   Temp 98.3 F (36.8 C)   Resp 16   Wt 181 lb 9.6 oz (82.4 kg)  SpO2 98%   BMI 31.17 kg/m   Physical Exam  Constitutional: She is oriented to person, place, and time and well-developed, well-nourished, and in no distress.  HENT:  Head: Normocephalic and atraumatic.  Right Ear: External ear normal.  Left Ear: External ear normal.  Nose: Nose normal.  Mouth/Throat: Oropharynx is clear and moist.  Maxillary sinus tenderness on right.  Eyes: Conjunctivae are normal. No scleral icterus.  Neck: No thyromegaly present.  Cardiovascular: Normal rate, regular rhythm and normal heart sounds.   Pulmonary/Chest: Effort normal and breath sounds normal.  Abdominal: Soft.  Neurological: She is alert and oriented to person,  place, and time. Gait normal. GCS score is 15.  Skin: Skin is warm and dry.  Psychiatric: Mood, memory, affect and judgment normal.    Assessment and Plan :  URI/Sinusitis F;uids and Zpak,rest.  I have done the exam and reviewed the chart and it is accurate to the best of my knowledge. Dentist has been used and  any errors in dictation or transcription are unintentional. Julieanne Manson M.D. [facility]

## 2017-09-05 ENCOUNTER — Telehealth: Payer: Self-pay

## 2017-09-05 NOTE — Telephone Encounter (Signed)
Pt called today stating that she is feeling better, but her cough is still severe. She has tried OTC cough medicine that has not helped.  She would like something stronger.  Please call when ready.  [phone]   Thanks,   -Vernona Rieger

## 2017-09-06 MED ORDER — HYDROCODONE-HOMATROPINE 5-1.5 MG/5ML PO SYRP: 5.0000 mL | ORAL_SOLUTION | Freq: Four times a day (QID) | ORAL | 0 refills | Status: DC | PRN

## 2017-09-06 NOTE — Telephone Encounter (Signed)
RX printed and placed up front for pick up, patient advised on voicemail-[name], RMA

## 2017-09-06 NOTE — Telephone Encounter (Signed)
Hycodin --5 cc po q 6 hrs prn cough,120cc,no rf.

## 2017-09-30 ENCOUNTER — Other Ambulatory Visit: Payer: Self-pay | Admitting: Family Medicine

## 2017-09-30 DIAGNOSIS — A048 Other specified bacterial intestinal infections: Secondary | ICD-10-CM

## 2017-09-30 DIAGNOSIS — G479 Sleep disorder, unspecified: Secondary | ICD-10-CM

## 2017-09-30 DIAGNOSIS — J0101 Acute recurrent maxillary sinusitis: Secondary | ICD-10-CM

## 2017-09-30 NOTE — Telephone Encounter (Signed)
rx called in-Elizabeth Munoz [name], RMA  

## 2017-10-12 ENCOUNTER — Other Ambulatory Visit: Payer: Self-pay | Admitting: Family Medicine

## 2017-10-12 DIAGNOSIS — R1010 Upper abdominal pain, unspecified: Secondary | ICD-10-CM

## 2017-10-12 NOTE — Telephone Encounter (Signed)
Pharmacy requesting refills. Thanks!  

## 2017-10-21 ENCOUNTER — Other Ambulatory Visit: Payer: Self-pay | Admitting: Family Medicine

## 2017-10-21 DIAGNOSIS — G479 Sleep disorder, unspecified: Secondary | ICD-10-CM

## 2017-10-22 ENCOUNTER — Other Ambulatory Visit: Payer: Self-pay | Admitting: Family Medicine

## 2017-10-22 ENCOUNTER — Other Ambulatory Visit: Payer: Self-pay | Admitting: Physician Assistant

## 2017-10-22 DIAGNOSIS — G479 Sleep disorder, unspecified: Secondary | ICD-10-CM

## 2018-01-02 ENCOUNTER — Other Ambulatory Visit: Payer: Self-pay | Admitting: Family Medicine

## 2018-01-02 DIAGNOSIS — K219 Gastro-esophageal reflux disease without esophagitis: Secondary | ICD-10-CM

## 2018-01-02 MED ORDER — PANTOPRAZOLE SODIUM 40 MG PO TBEC: 40.0000 mg | DELAYED_RELEASE_TABLET | Freq: Every day | ORAL | 5 refills | Status: DC

## 2018-01-02 NOTE — Telephone Encounter (Signed)
I do not see this on pt current med list. Ok to refill?

## 2018-01-02 NOTE — Telephone Encounter (Signed)
sent to pharmacy

## 2018-01-02 NOTE — Telephone Encounter (Signed)
Looking at past medication list she was on Pantoprazole and this was taking out/ D/C in June 2018. She is on Omeprazole and Carafate per her list.  LMTCB to verify with patient. This request came from pharmacy not patient, this may have bee on their list still but no longer accurate.-Thanvi Blincoe V Shaterra Sanzone, RMA

## 2018-01-02 NOTE — Telephone Encounter (Signed)
Ok--5 rf. 

## 2018-01-02 NOTE — Telephone Encounter (Signed)
[name] fax[facility] refill request for the following medications:  pantoprazole (PROTONIX) 40 MG tablet   Last Rx: 03/05/16 LOV: 09/01/17 Please advise. Thanks TNP

## 2018-01-23 ENCOUNTER — Other Ambulatory Visit: Payer: Self-pay | Admitting: Family Medicine

## 2018-01-26 ENCOUNTER — Ambulatory Visit: Payer: Self-pay | Admitting: Family Medicine

## 2018-02-03 ENCOUNTER — Other Ambulatory Visit: Payer: Self-pay | Admitting: Family Medicine

## 2018-02-03 DIAGNOSIS — G479 Sleep disorder, unspecified: Secondary | ICD-10-CM

## 2018-02-07 ENCOUNTER — Other Ambulatory Visit: Payer: Self-pay | Admitting: Family Medicine

## 2018-02-07 DIAGNOSIS — G479 Sleep disorder, unspecified: Secondary | ICD-10-CM

## 2018-02-07 NOTE — Telephone Encounter (Signed)
Pt needs refills on Alprazolam 0.5 mg called to [facility]

## 2018-02-08 MED ORDER — ALPRAZOLAM 0.5 MG PO TABS: 0.5000 mg | ORAL_TABLET | Freq: Every evening | ORAL | 1 refills | Status: DC | PRN

## 2018-03-12 IMAGING — US US ABDOMEN LIMITED
1 series · 14 of 25 positions shown · non-contrast
Comparison: None.

CLINICAL DATA: Abdominal pain.

EXAM:
US ABDOMEN LIMITED - RIGHT UPPER QUADRANT

[Series 1: us abdomen limited · 0.22mm/px · 14 of 41 slices shown]
[im 1/41]
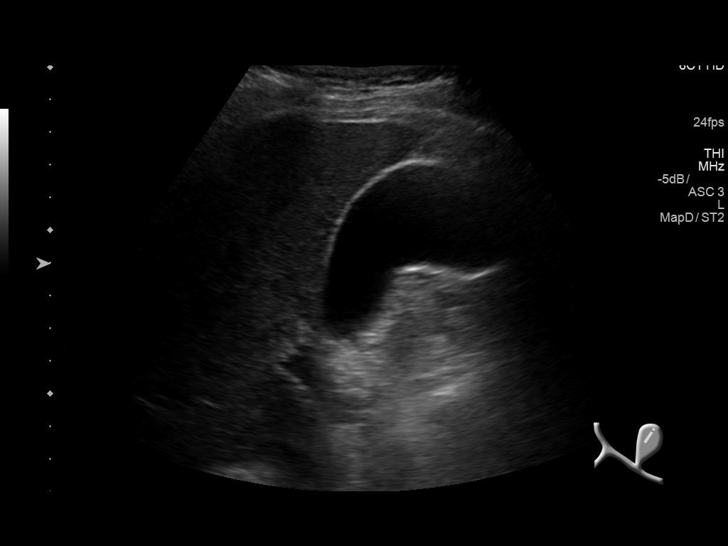
[im 4/41]
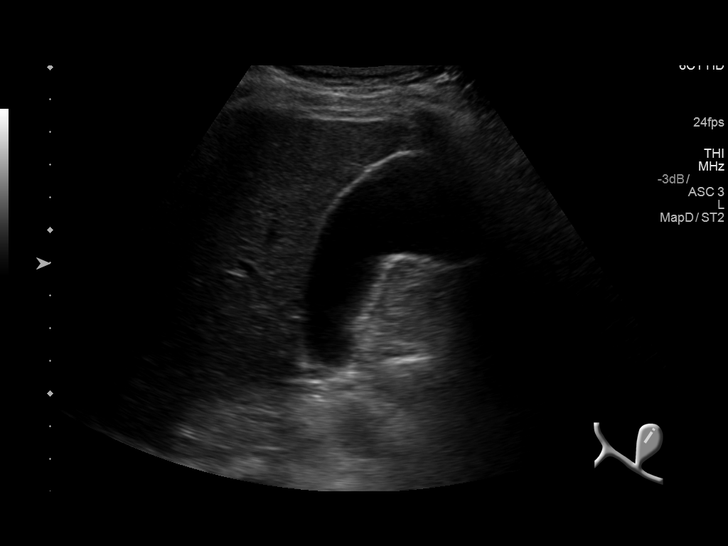
[im 7/41]
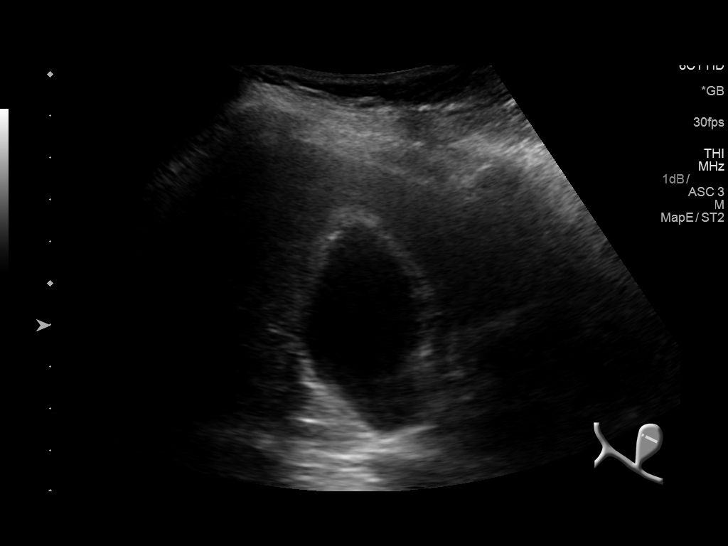
[im 11/41]
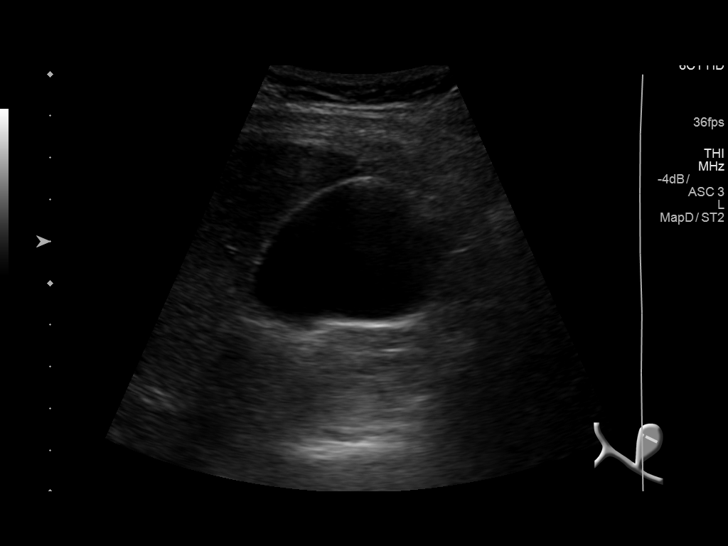
[im 14/41]
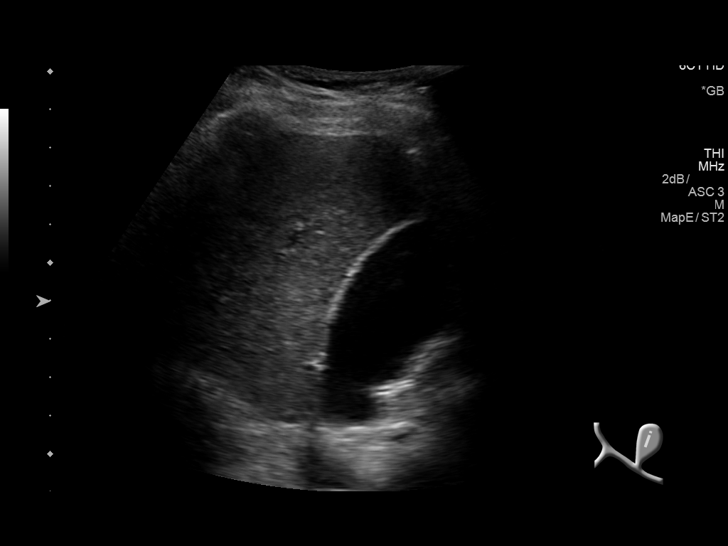
[im 16/41]
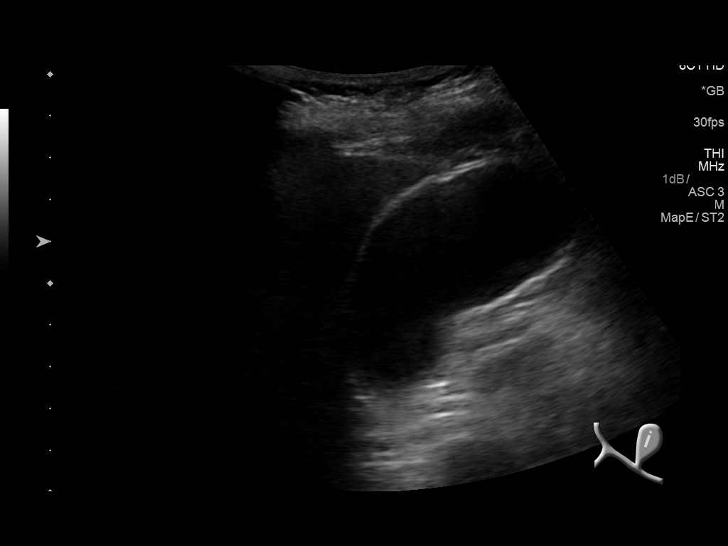
[im 19/41]
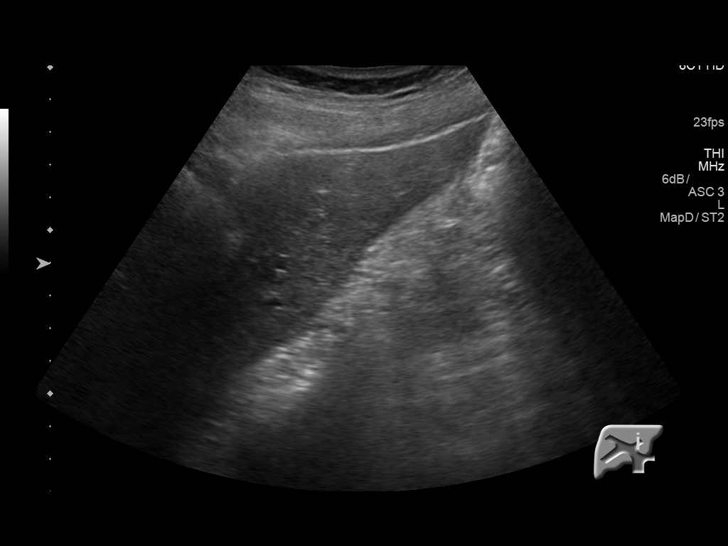
[im 22/41]
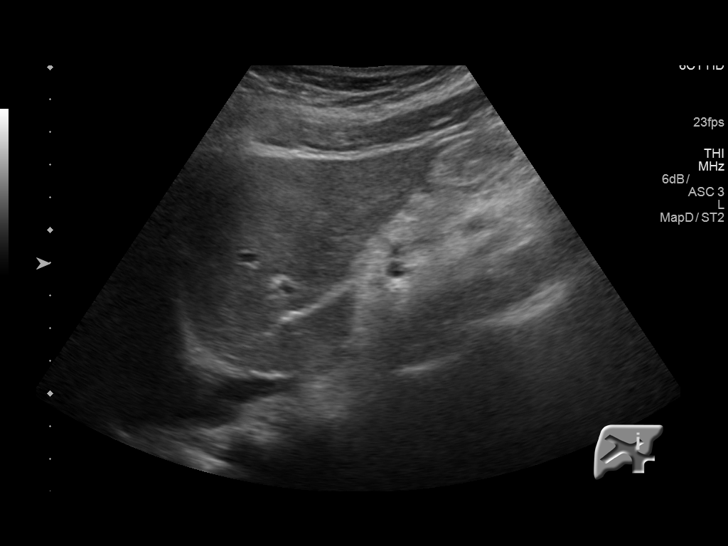
[im 26/41]
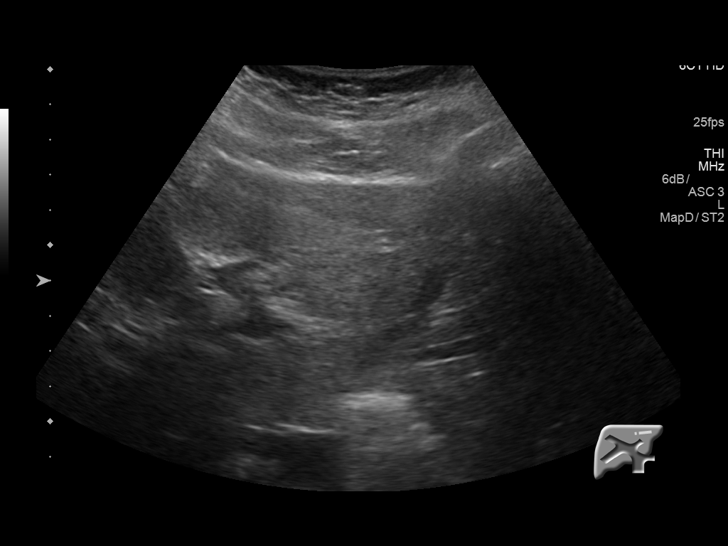
[im 27/41]
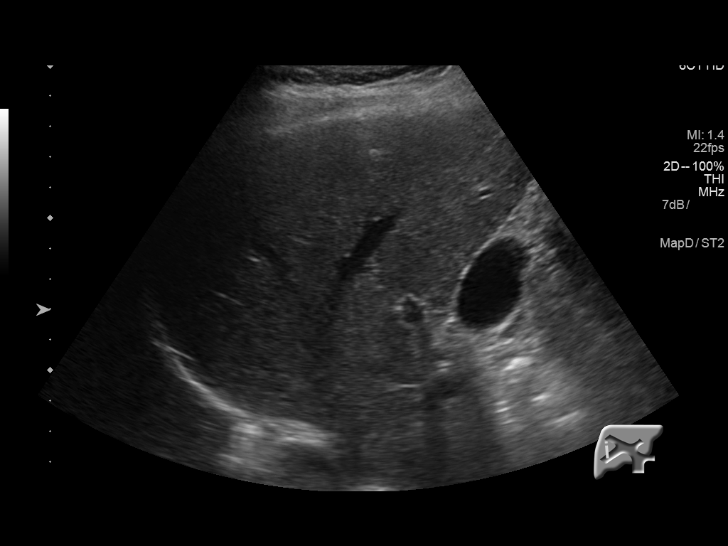
[im 31/41]
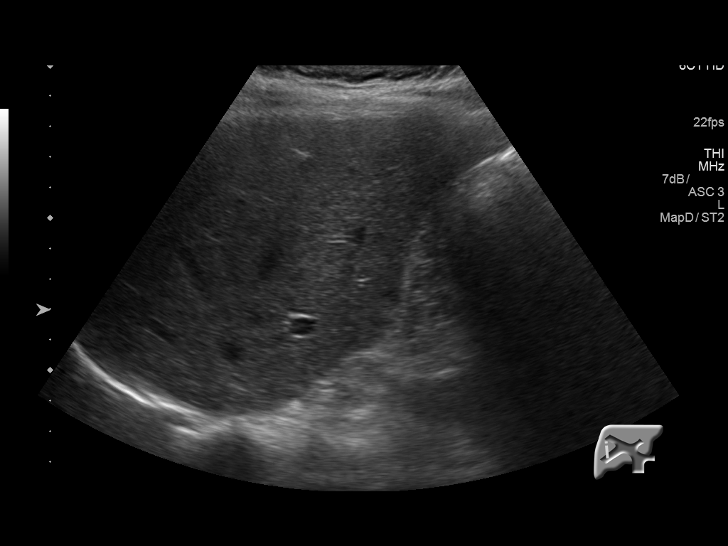
[im 34/41]
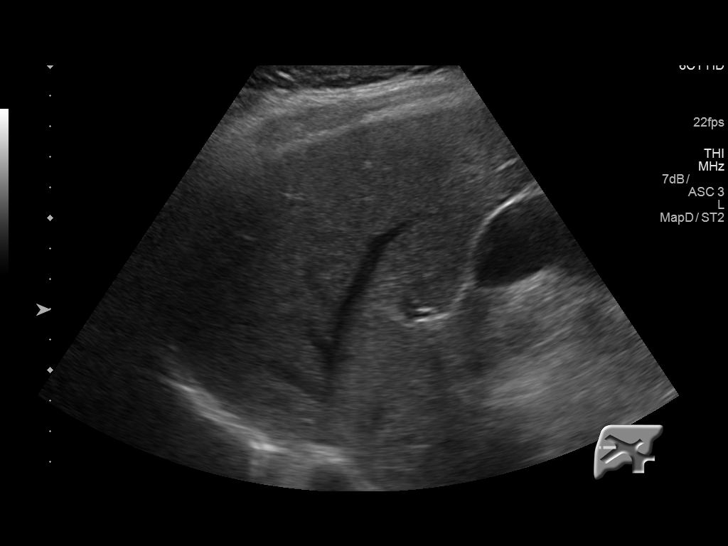
[im 37/41]
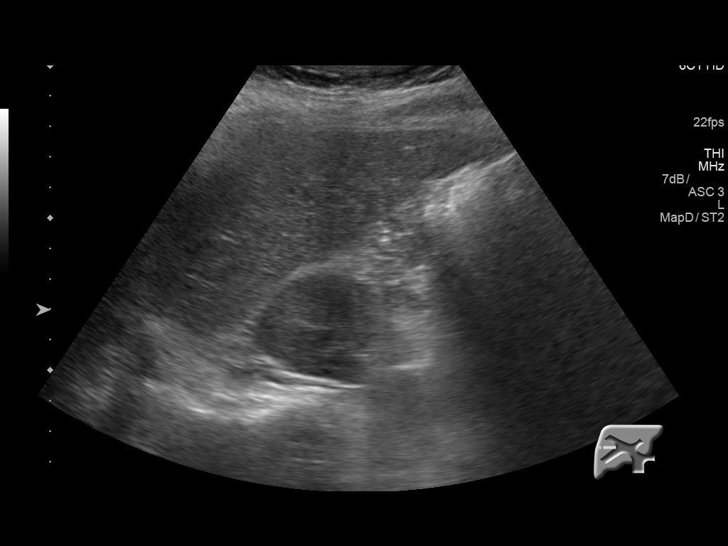
[im 41/41]
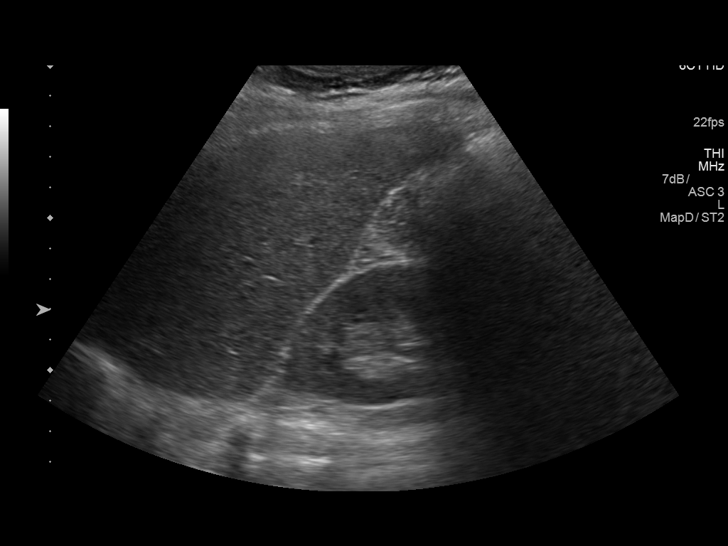

[14 of 25 positions shown; findings below may reference images not displayed]

FINDINGS: Gallbladder:

No gallstones or wall thickening visualized. No sonographic Murphy
sign noted by sonographer.

Common bile duct:

Diameter: 4.0 mm

Liver:

Tiny shadowing echogenic foci noted in the liver most likely
granulomas.
IMPRESSION: No significant abnormality identified. No gallstones or biliary
distention.

## 2018-05-29 ENCOUNTER — Encounter: Payer: Self-pay | Admitting: Obstetrics and Gynecology

## 2018-05-29 ENCOUNTER — Ambulatory Visit (INDEPENDENT_AMBULATORY_CARE_PROVIDER_SITE_OTHER): Payer: BLUE CROSS/BLUE SHIELD | Admitting: Obstetrics and Gynecology

## 2018-05-29 ENCOUNTER — Other Ambulatory Visit: Payer: Self-pay | Admitting: Gastroenterology

## 2018-05-29 VITALS — BP 126/90 | HR 106 | Ht 64.0 in | Wt 179.0 lb

## 2018-05-29 DIAGNOSIS — N951 Menopausal and female climacteric states: Secondary | ICD-10-CM | POA: Diagnosis not present

## 2018-05-29 DIAGNOSIS — K219 Gastro-esophageal reflux disease without esophagitis: Secondary | ICD-10-CM

## 2018-05-29 DIAGNOSIS — Z1211 Encounter for screening for malignant neoplasm of colon: Secondary | ICD-10-CM

## 2018-05-29 DIAGNOSIS — Z124 Encounter for screening for malignant neoplasm of cervix: Secondary | ICD-10-CM | POA: Diagnosis not present

## 2018-05-29 DIAGNOSIS — Z1239 Encounter for other screening for malignant neoplasm of breast: Secondary | ICD-10-CM

## 2018-05-29 DIAGNOSIS — Z1231 Encounter for screening mammogram for malignant neoplasm of breast: Secondary | ICD-10-CM

## 2018-05-29 DIAGNOSIS — Z01419 Encounter for gynecological examination (general) (routine) without abnormal findings: Secondary | ICD-10-CM

## 2018-05-29 DIAGNOSIS — N920 Excessive and frequent menstruation with regular cycle: Secondary | ICD-10-CM | POA: Diagnosis not present

## 2018-05-29 DIAGNOSIS — Z1151 Encounter for screening for human papillomavirus (HPV): Secondary | ICD-10-CM

## 2018-05-29 DIAGNOSIS — R6881 Early satiety: Secondary | ICD-10-CM

## 2018-05-29 DIAGNOSIS — R635 Abnormal weight gain: Secondary | ICD-10-CM

## 2018-05-29 DIAGNOSIS — G43119 Migraine with aura, intractable, without status migrainosus: Secondary | ICD-10-CM

## 2018-05-29 DIAGNOSIS — R1013 Epigastric pain: Secondary | ICD-10-CM

## 2018-05-29 HISTORY — DX: Menopausal and female climacteric states: N95.1

## 2018-05-29 NOTE — Progress Notes (Signed)
PCP: Maple Hudson., MD   Chief Complaint  Patient presents with  . Gynecologic Exam    Hot flashes, gaining weight, woud like pap    HPI:      [name] is a 52 y.o. G2P0202 who LMP was Patient's last menstrual period was 05/12/2018 (exact date)., presents today for her NP annual examination.  Her menses are regular every 28-30 days, lasting 10 days; first 5 days are heavy and with clots.  Dysmenorrhea moderate, occurring first 1-2 days of flow. She does not have intermenstrual bleeding. She was seen at [facility] in 2016 and had neg EMB after EM=11 mm. Notes say pt had DUB but pt states her menses were monthly and normal. She does have vasomotor sx.   She has issues with menstrual migraines with aura and non-menstrual migraines. Pt sees ENT for sx. Had neg head CT scan.   Sex activity: single partner, contraception - none. She does not have vaginal dryness.  Last Pap: June 19, 2015  Results were: no abnormalities /neg HPV DNA.  Hx of STDs: none  Last mammogram: May 31, 2016  Results were: normal--routine follow-up in 12 months There is no FH of breast cancer. There is no FH of ovarian cancer. The patient does do self-breast exams.  Colonoscopy: not done; pt wants to wait till 53  Tobacco use: The patient denies current or previous tobacco use. Alcohol use: none Exercise: not active  She does get adequate calcium but not Vitamin D in her diet.  Labs with PCP. Cont to have issues with wt gain. Eats very little due to delayed gastric emptying. Seeing GI.   Past Medical History:  Diagnosis Date  . Depression   . GERD (gastroesophageal reflux disease)   . H/O pyloric stenosis    06/24/2015 noted also by Dr. Sullivan Lone  . Insomnia   . Migraine 06/19/2015    Past Surgical History:  Procedure Laterality Date  . BREAST REDUCTION SURGERY    . CESAREAN SECTION     x 2  . TUBAL LIGATION    . UPPER GI ENDOSCOPY     chronic gastritis, LA esophagitis     Family History  Problem Relation Age of Onset  . Alzheimer's disease Mother   . Diabetes Father   . Prostate cancer Father   . Diabetes Brother     Social History   Socioeconomic History  . Marital status: Single    Spouse name: Not on file  . Number of children: Not on file  . Years of education: Not on file  . Highest education level: Not on file  Occupational History  . Not on file  Social Needs  . Financial resource strain: Not on file  . Food insecurity:    Worry: Not on file    Inability: Not on file  . Transportation needs:    Medical: Not on file    Non-medical: Not on file  Tobacco Use  . Smoking status: Never Smoker  . Smokeless tobacco: Never Used  Substance and Sexual Activity  . Alcohol use: No    Alcohol/week: 0.0 oz  . Drug use: No  . Sexual activity: Not Currently    Birth control/protection: Surgical  Lifestyle  . Physical activity:    Days per week: Not on file    Minutes per session: Not on file  . Stress: Not on file  Relationships  . Social connections:    Talks on phone: Not on file  Gets together: Not on file    Attends religious service: Not on file    Active member of club or organization: Not on file    Attends meetings of clubs or organizations: Not on file    Relationship status: Not on file  . Intimate partner violence:    Fear of current or ex partner: Not on file    Emotionally abused: Not on file    Physically abused: Not on file    Forced sexual activity: Not on file  Other Topics Concern  . Not on file  Social History Narrative  . Not on file    Outpatient Medications Prior to Visit  Medication Sig Dispense Refill  . ALPRAZolam (XANAX) 0.5 MG tablet TAKE 1 TABLET AT BEDTIME AS NEEDED. 30 tablet 0  . azithromycin (ZITHROMAX) 250 MG tablet Take 2 tablets today, then 1 tablet daily thereafter. 6 tablet 0  . butalbital-acetaminophen-caffeine (FIORICET, ESGIC) 50-325-40 MG tablet TAKE ONE TO TWO TABLETS EVERY FOUR  HOURSAS NEEDED FOR HEADACHE 35 tablet 3  . cetirizine (ZYRTEC) 10 MG tablet Take 1 tablet (10 mg total) by mouth daily. 30 tablet 11  . dexlansoprazole (DEXILANT) 60 MG capsule Take by mouth.    . fluticasone (FLONASE) 50 MCG/ACT nasal spray USE 2 SPRAYS EACH NOSTRIL DAILY 16 g 12  . furosemide (LASIX) 20 MG tablet Take by mouth.    . gabapentin (NEURONTIN) 600 MG tablet Take 1 tablet (600 mg total) by mouth at bedtime. 90 tablet 1  . HYDROcodone-homatropine (HYCODAN) 5-1.5 MG/5ML syrup Take 5 mLs by mouth every 6 (six) hours as needed for cough. 120 mL 0  . Multiple Vitamin (MULTIVITAMIN) tablet Take 1 tablet by mouth daily.    Marland Kitchen. omeprazole (PRILOSEC) 20 MG capsule Take 1 capsule (20 mg total) by mouth 2 (two) times daily before a meal. 60 capsule 1  . pantoprazole (PROTONIX) 40 MG tablet Take 1 tablet (40 mg total) by mouth daily. 30 tablet 5  . polyethylene glycol powder (GLYCOLAX/MIRALAX) powder Take 17 g by mouth daily. 3350 g 5  . RABEprazole (ACIPHEX) 20 MG tablet Take 20 mg by mouth 2 (two) times daily.    . sertraline (ZOLOFT) 100 MG tablet Take 1 tablet (100 mg total) by mouth daily. 90 tablet 3  . sucralfate (CARAFATE) 1 g tablet TAKE ONE (1) TABLET FOUR (4) TIMES PER DAY -WITH MEALS AND AT BEDTIME 240 tablet 5  . SUMAtriptan (IMITREX) 50 MG tablet Take by mouth.    . temazepam (RESTORIL) 30 MG capsule TAKE ONE CAPSULE AT BEDTIME AS NEEDED FOR SLEEP 30 capsule 2  . traZODone (DESYREL) 100 MG tablet Take 1 tablet (100 mg total) by mouth at bedtime. 30 tablet 12  . Verapamil HCl CR 200 MG CP24 Take 1 capsule (200 mg total) by mouth daily. 30 each 12  . ALPRAZolam (XANAX) 0.5 MG tablet TAKE 1 TABLET BY MOUTH AT BEDTIME AS NEEDED (Patient not taking: Reported on 05/29/2018) 30 tablet 1  . ALPRAZolam (XANAX) 0.5 MG tablet Take 1 tablet (0.5 mg total) by mouth at bedtime as needed. (Patient not taking: Reported on 05/29/2018) 30 tablet 1   No facility-administered medications prior to visit.       ROS:  Review of Systems  Constitutional: Negative for fatigue, fever and unexpected weight change.  Respiratory: Negative for cough, shortness of breath and wheezing.   Cardiovascular: Negative for chest pain, palpitations and leg swelling.  Gastrointestinal: Negative for blood in stool, constipation, diarrhea, nausea  and vomiting.  Endocrine: Negative for cold intolerance, heat intolerance and polyuria.  Genitourinary: Negative for dyspareunia, dysuria, flank pain, frequency, genital sores, hematuria, menstrual problem, pelvic pain, urgency, vaginal bleeding, vaginal discharge and vaginal pain.  Musculoskeletal: Negative for back pain, joint swelling and myalgias.  Skin: Negative for rash.  Neurological: Positive for headaches. Negative for dizziness, syncope, light-headedness and numbness.  Hematological: Negative for adenopathy.  Psychiatric/Behavioral: Positive for dysphoric mood. Negative for agitation, confusion, sleep disturbance and suicidal ideas. The patient is not nervous/anxious.    BREAST: No symptoms    Objective: BP 126/90   Pulse (!) 106   Ht 5\' 4"  (1.626 m)   Wt 179 lb (81.2 kg)   LMP 05/12/2018 (Exact Date)   BMI 30.73 kg/m    Physical Exam  Constitutional: She is oriented to person, place, and time. She appears well-developed and well-nourished.  Genitourinary: Vagina normal and uterus normal. There is no rash or tenderness on the right labia. There is no rash or tenderness on the left labia. No erythema or tenderness in the vagina. No vaginal discharge found. Right adnexum does not display mass and does not display tenderness. Left adnexum does not display mass and does not display tenderness. Cervix does not exhibit motion tenderness or polyp. Uterus is not enlarged or tender.  Neck: Normal range of motion. No thyromegaly present.  Cardiovascular: Normal rate, regular rhythm and normal heart sounds.  No murmur heard. Pulmonary/Chest: Effort normal and  breath sounds normal. Right breast exhibits no mass, no nipple discharge, no skin change and no tenderness. Left breast exhibits no mass, no nipple discharge, no skin change and no tenderness.  Abdominal: Soft. There is no tenderness. There is no guarding.  Musculoskeletal: Normal range of motion.  Neurological: She is alert and oriented to person, place, and time. No cranial nerve deficit.  Psychiatric: She has a normal mood and affect. Her behavior is normal.  Vitals reviewed.   Assessment/Plan:  Encounter for annual routine gynecological examination  Cervical cancer screening - Plan: IGP, Aptima HPV  Screening for HPV (human papillomavirus) - Plan: IGP, Aptima HPV  Screening for breast cancer - Pt to sched mammo - Plan: MM DIGITAL SCREENING BILATERAL  Screening for colon cancer - Pt plans to sched colonoscopy at age 73. Seeing GI for stomach issues.   Perimenopause - F/u prn DUB.  Menorrhagia with regular cycle - Discussed Mirena. Handout given. Pt to f/u prn.   Intractable migraine with aura without status migrainosus         GYN counsel breast self exam, mammography screening, menopause, adequate intake of calcium and vitamin D, diet and exercise    F/U  Return in about 1 year (around 05/30/2019).  Lyann Hagstrom B. Linsy Ehresman, PA-C 05/29/2018 5:01 PM

## 2018-05-29 NOTE — Patient Instructions (Signed)
I value your feedback and entrusting us with your care. If you get a [facility] patient survey, I would appreciate you taking the time to let us know about your experience today. Thank you! 

## 2018-06-01 LAB — IGP, APTIMA HPV
HPV Aptima: NEGATIVE
PAP SMEAR COMMENT: 0

## 2018-06-06 ENCOUNTER — Other Ambulatory Visit: Payer: Self-pay

## 2018-06-06 DIAGNOSIS — G479 Sleep disorder, unspecified: Secondary | ICD-10-CM

## 2018-06-06 NOTE — Telephone Encounter (Signed)
Patient requesting refills. Patient request call back if refills are not possible before appointment next week.

## 2018-06-08 ENCOUNTER — Encounter: Payer: Self-pay | Admitting: Obstetrics and Gynecology

## 2018-06-08 ENCOUNTER — Other Ambulatory Visit: Payer: Self-pay | Admitting: Family Medicine

## 2018-06-08 NOTE — Telephone Encounter (Signed)
Pt contacted office for refill request on the following medications:  1. ALPRAZolam (XANAX) 0.5 MG tablet 2. temazepam (RESTORIL) 30 MG capsule  Elizabeth Munoz  Pt stated that she has been trying to get these medications filled since 06/06/18 and she has been a pt for thousand years and she has never been told it can take 24 to 48 hours for a refill request to be processed before. Pt requested to speak with a nurse. Pt was advised that I couldn't pull a nurse from a pt's room and I would ask a nurse to return her call. Please advise. Thanks TNP

## 2018-06-08 NOTE — Telephone Encounter (Signed)
Please send into the pharmacy. Thanks!  °

## 2018-06-14 ENCOUNTER — Ambulatory Visit: Payer: Self-pay | Admitting: Family Medicine

## 2018-06-14 NOTE — Progress Notes (Deleted)
Patient: Elizabeth Munoz Female    DOB: 15-Dec-1966   52 y.o.   MRN: [id number] Visit Date: 06/14/2018  Today's Provider: Megan Mans, MD   No chief complaint on file.  Subjective:    HPI Sleep Disorder: Patient was last seen for this problem more than 1 year ago. Changes made since the last visit includes    Allergies  Allergen Reactions  . Imitrex [Sumatriptan]     Stomach pain, nausea, vomiting, felt bad     Current Outpatient Medications:  .  ALPRAZolam (XANAX) 0.5 MG tablet, TAKE 1 TABLET AT BEDTIME AS NEEDED., Disp: 30 tablet, Rfl: 0 .  ALPRAZolam (XANAX) 0.5 MG tablet, TAKE 1 TABLET BY MOUTH AT BEDTIME AS NEEDED (Patient not taking: Reported on 05/29/2018), Disp: 30 tablet, Rfl: 1 .  ALPRAZolam (XANAX) 0.5 MG tablet, Take 1 tablet (0.5 mg total) by mouth at bedtime as needed. (Patient not taking: Reported on 05/29/2018), Disp: 30 tablet, Rfl: 1 .  ALPRAZolam (XANAX) 0.5 MG tablet, TAKE 1 TABLET BY MOUTH ONCE A DAY, Disp: 30 tablet, Rfl: 0 .  azithromycin (ZITHROMAX) 250 MG tablet, Take 2 tablets today, then 1 tablet daily thereafter., Disp: 6 tablet, Rfl: 0 .  butalbital-acetaminophen-caffeine (FIORICET, ESGIC) 50-325-40 MG tablet, TAKE ONE TO TWO TABLETS EVERY FOUR HOURSAS NEEDED FOR HEADACHE, Disp: 35 tablet, Rfl: 3 .  cetirizine (ZYRTEC) 10 MG tablet, Take 1 tablet (10 mg total) by mouth daily., Disp: 30 tablet, Rfl: 11 .  dexlansoprazole (DEXILANT) 60 MG capsule, Take by mouth., Disp: , Rfl:  .  fluticasone (FLONASE) 50 MCG/ACT nasal spray, USE 2 SPRAYS EACH NOSTRIL DAILY, Disp: 16 g, Rfl: 12 .  furosemide (LASIX) 20 MG tablet, Take by mouth., Disp: , Rfl:  .  gabapentin (NEURONTIN) 600 MG tablet, Take 1 tablet (600 mg total) by mouth at bedtime., Disp: 90 tablet, Rfl: 1 .  HYDROcodone-homatropine (HYCODAN) 5-1.5 MG/5ML syrup, Take 5 mLs by mouth every 6 (six) hours as needed for cough., Disp: 120 mL, Rfl: 0 .  Multiple Vitamin (MULTIVITAMIN) tablet, Take  1 tablet by mouth daily., Disp: , Rfl:  .  omeprazole (PRILOSEC) 20 MG capsule, Take 1 capsule (20 mg total) by mouth 2 (two) times daily before a meal., Disp: 60 capsule, Rfl: 1 .  pantoprazole (PROTONIX) 40 MG tablet, Take 1 tablet (40 mg total) by mouth daily., Disp: 30 tablet, Rfl: 5 .  polyethylene glycol powder (GLYCOLAX/MIRALAX) powder, Take 17 g by mouth daily., Disp: 3350 g, Rfl: 5 .  RABEprazole (ACIPHEX) 20 MG tablet, Take 20 mg by mouth 2 (two) times daily., Disp: , Rfl:  .  sertraline (ZOLOFT) 100 MG tablet, Take 1 tablet (100 mg total) by mouth daily., Disp: 90 tablet, Rfl: 3 .  sucralfate (CARAFATE) 1 g tablet, TAKE ONE (1) TABLET FOUR (4) TIMES PER DAY -WITH MEALS AND AT BEDTIME, Disp: 240 tablet, Rfl: 5 .  SUMAtriptan (IMITREX) 50 MG tablet, Take by mouth., Disp: , Rfl:  .  temazepam (RESTORIL) 30 MG capsule, TAKE 1 CAPSULE AT BEDTIME AS NEEDED FOR SLEEP, Disp: 30 capsule, Rfl: 1 .  traZODone (DESYREL) 100 MG tablet, Take 1 tablet (100 mg total) by mouth at bedtime., Disp: 30 tablet, Rfl: 12 .  Verapamil HCl CR 200 MG CP24, Take 1 capsule (200 mg total) by mouth daily., Disp: 30 each, Rfl: 12  Review of Systems  Social History   Tobacco Use  . Smoking status: Never Smoker  . Smokeless  tobacco: Never Used  Substance Use Topics  . Alcohol use: No    Alcohol/week: 0.0 oz   Objective:   There were no vitals taken for this visit. There were no vitals filed for this visit.   Physical Exam      Assessment & Plan:           Megan Mansichard Gilbert Jr, MD  [facility]

## 2018-06-16 ENCOUNTER — Other Ambulatory Visit: Payer: Self-pay | Admitting: Family Medicine

## 2018-06-16 DIAGNOSIS — G479 Sleep disorder, unspecified: Secondary | ICD-10-CM

## 2018-06-16 NOTE — Telephone Encounter (Signed)
[facility] fax[facility] refill request for the following medications:  ALPRAZolam [name](XANAX) 0.5 MG tablet  Last Rx: 06/08/18 LOV: 09/01/17 Pt was scheduled for OV on 06/14/18 but didn't come to the appt. Please advise. Thanks TNP

## 2018-06-19 NOTE — Telephone Encounter (Signed)
Ok to order? Please advise. Thanks!  

## 2018-06-30 ENCOUNTER — Other Ambulatory Visit: Payer: Self-pay | Admitting: Family Medicine

## 2018-07-14 IMAGING — MR MR HEAD WO/W CM
10 series · 44 of 48 positions shown · IV contrast (multihance)
Comparison: None.

CLINICAL DATA: Chronic daily migraines.

EXAM:
MRI HEAD WITHOUT AND WITH CONTRAST
TECHNIQUE: Multiplanar, multiecho pulse sequences of the brain and surrounding
structures were obtained without and with intravenous contrast.
CONTRAST:  15mL MULTIHANCE GADOBENATE DIMEGLUMINE 529 MG/ML IV SOLN

[Series 2: T1 · sagittal · 5.0mm · 0.45mm/px · 3 of 25 slices shown (1 of 2)]
[im 1/25]
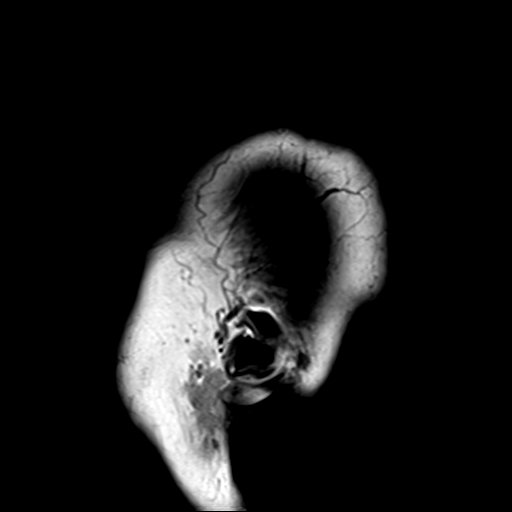
[im 13/25]
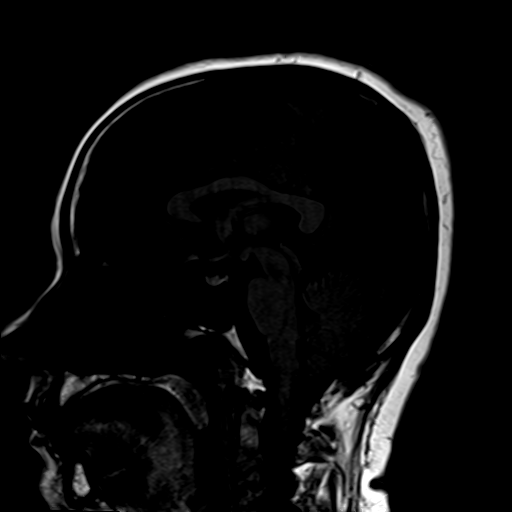
[im 25/25]
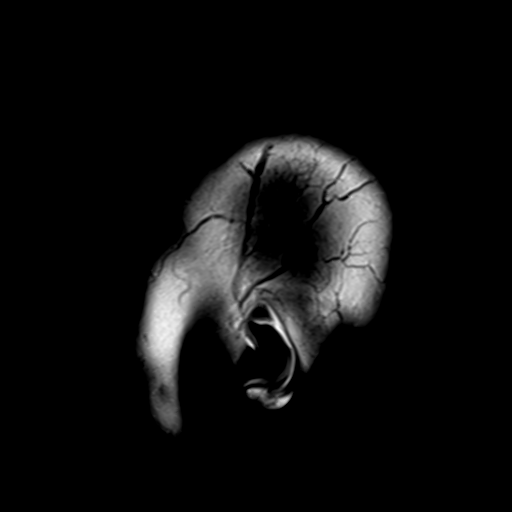

[Series 5: T2 · axial · 5.0mm · 0.60mm/px · z∈[-61,+95]mm · 3 of 25 slices shown (1 of 2)]
[im 1/25]
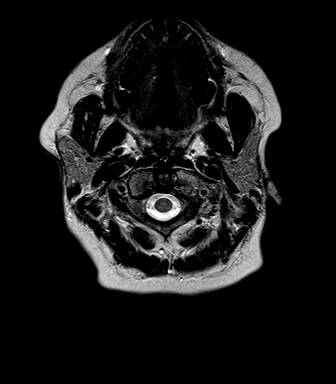
[im 13/25]
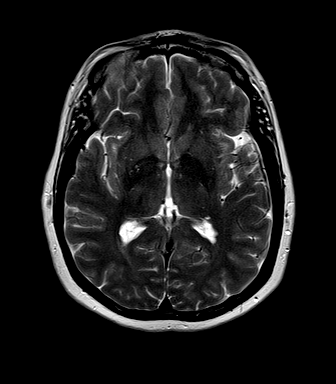
[im 25/25]
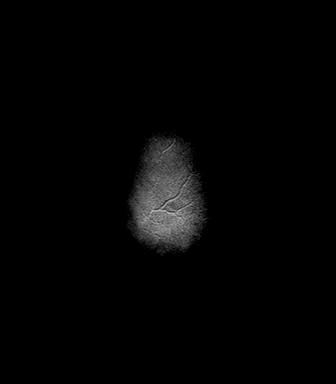

[Series 6: FLAIR · axial · 5.0mm · 0.45mm/px · z∈[-61,+95]mm · 3 of 25 slices shown]
[im 1/25]
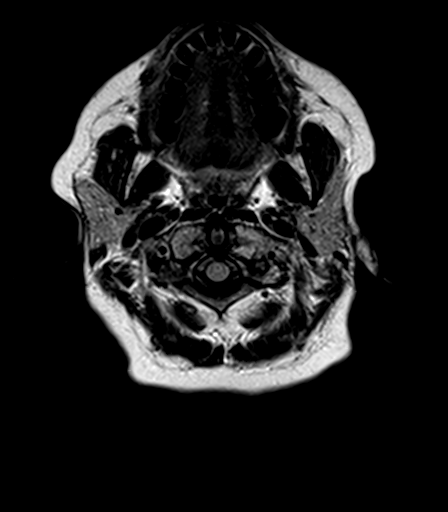
[im 13/25]
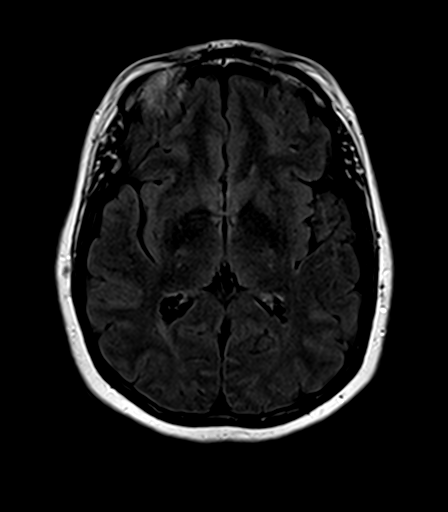
[im 25/25]
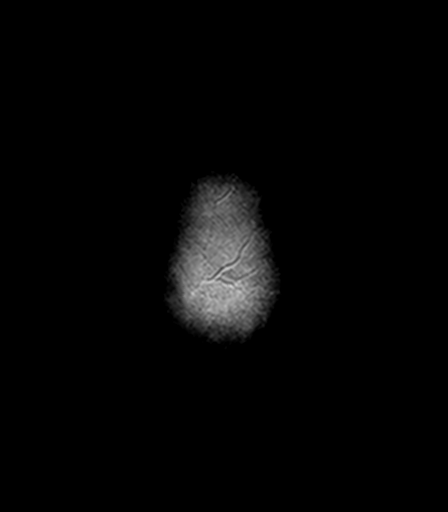

[Series 7: T2 · axial · 5.0mm · 0.45mm/px · z∈[-61,+95]mm · 3 of 25 slices shown (2 of 2)]
[im 1/25]
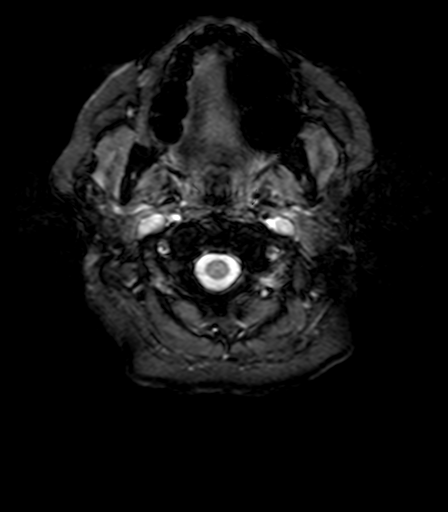
[im 13/25]
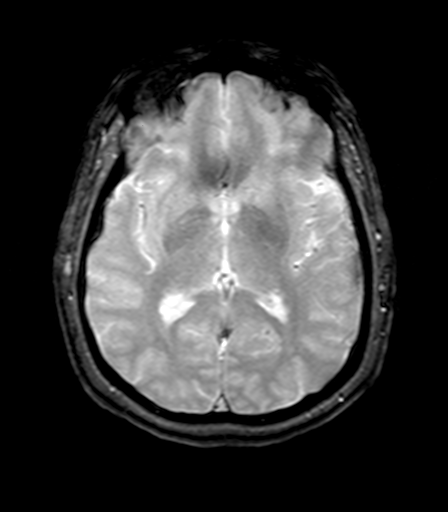
[im 25/25]
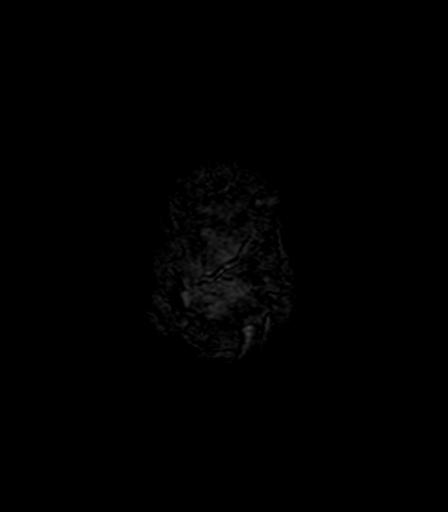

[Series 8: T1 · axial · 3.0mm · 1.00mm/px · z∈[-59,-8]mm · 3 of 52 slices shown (2 of 2)]
[im 1/52]
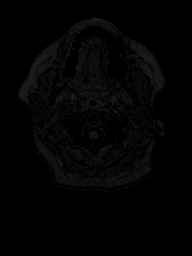
[im 9/52]
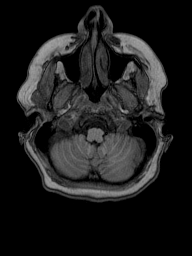
[im 18/52]
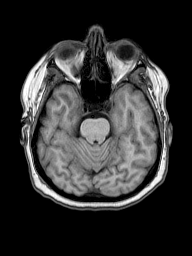

[Series 9: T2 post-contrast · coronal · 5.0mm · 0.49mm/px · 4 of 29 slices shown]
[im 1/29]
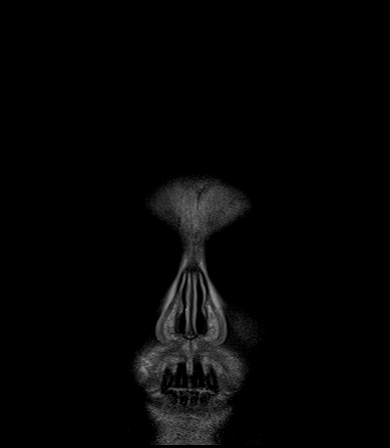
[im 10/29]
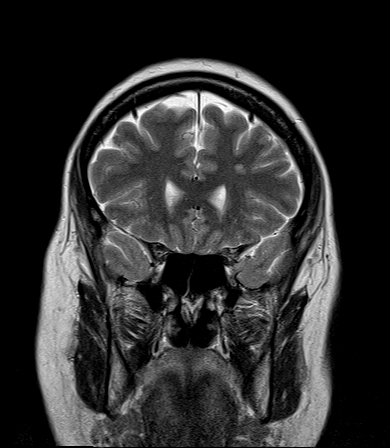
[im 19/29]
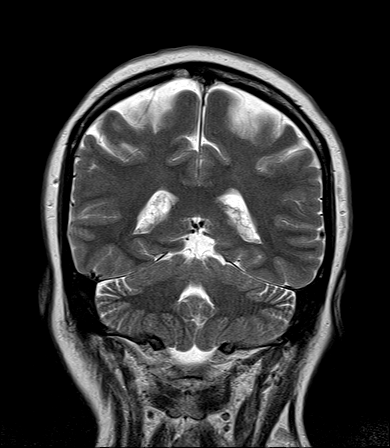
[im 29/29]
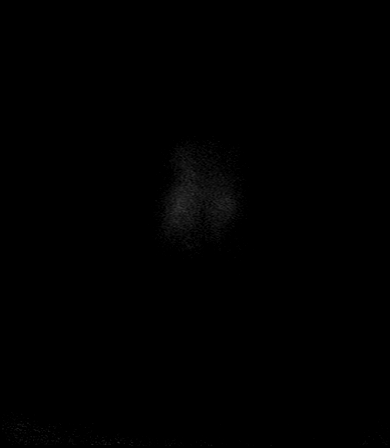

[Series 10: T1 post-contrast · axial · 3.0mm · 1.00mm/px · z∈[-59,+94]mm · 7 of 52 slices shown (1 of 2)]
[im 1/52]
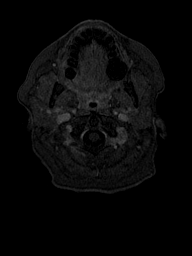
[im 9/52]
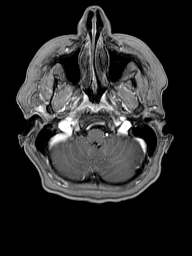
[im 18/52]
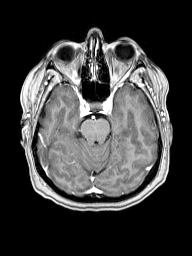
[im 26/52]
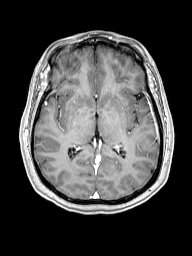
[im 35/52]
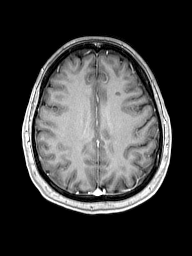
[im 43/52]
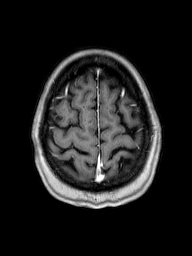
[im 52/52]
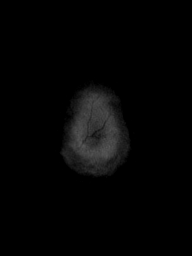

[Series 11: T1 post-contrast · coronal · 5.0mm · 0.43mm/px · 4 of 29 slices shown (2 of 2)]
[im 1/29]
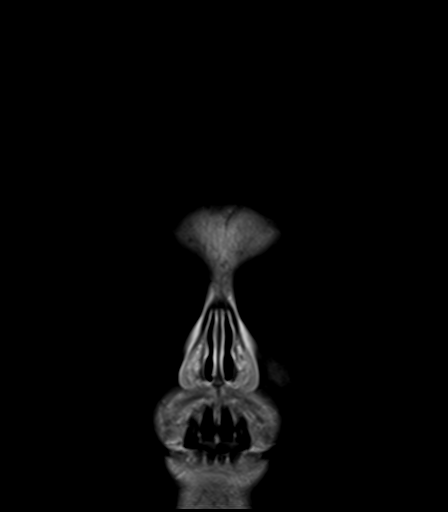
[im 10/29]
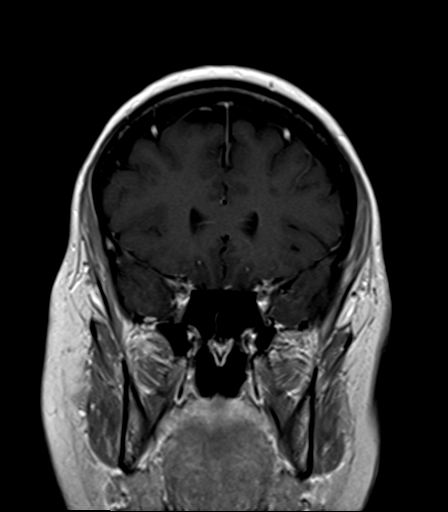
[im 19/29]
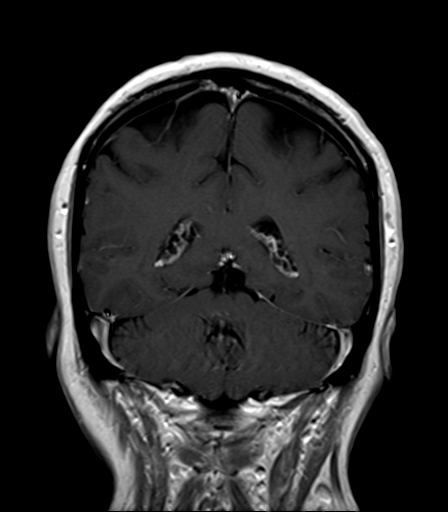
[im 29/29]
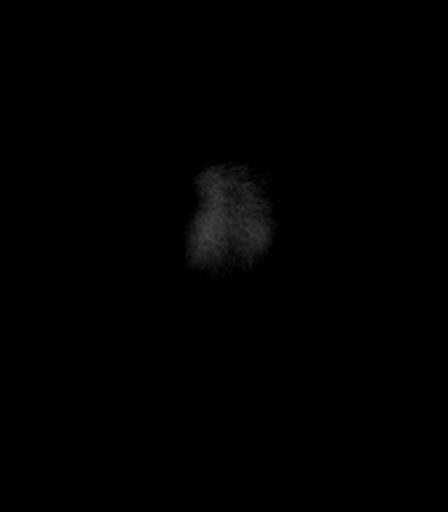

[Series 100: DWI · axial · 3.0mm · 1.80mm/px · z∈[-64,+98]mm · 7 of 55 slices shown]
[im 1/55]
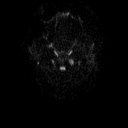
[im 10/55]
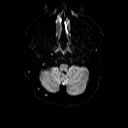
[im 19/55]
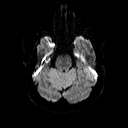
[im 28/55]
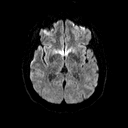
[im 37/55]
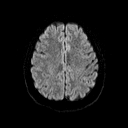
[im 46/55]
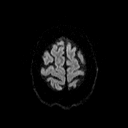
[im 55/55]
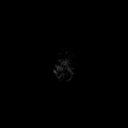

[Series 101: ADC · axial · 3.0mm · 1.80mm/px · z∈[-64,+98]mm · 7 of 55 slices shown]
[im 1/55]
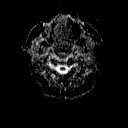
[im 10/55]
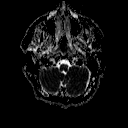
[im 19/55]
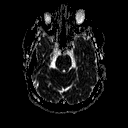
[im 28/55]
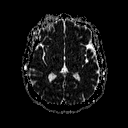
[im 37/55]
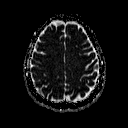
[im 46/55]
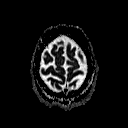
[im 55/55]
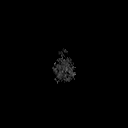

[44 of 48 positions shown; findings below may reference images not displayed]

FINDINGS: Brain Parenchyma: No acute infarct or intraparenchymal hemorrhage.
Prominent perivascular space noted in the left frontal white matter.
There a few scattered T2 hyperintense foci within the subcortical
white matter, which are nonspecific. This may be seen in setting of
migraine headaches, but is also seen in normal patients of this age.
No mass lesion or midline shift. The major intracranial flow voids
are preserved. There is a small developmental venous anomaly within
the right cerebellar hemisphere. The midline structures are normal.

Ventricles, Sulci and Extra-axial Spaces: Normal for age. No
extra-axial collection.

Paranasal Sinuses and Mastoids: No fluid levels or advanced mucosal
thickening.

Orbits: Normal.

Bones and Soft Tissues: The visualized skull base, calvarium and
extracranial soft tissues are normal.
IMPRESSION: Normal MRI of the brain for age. No findings to explain the
patient's headaches.

## 2018-07-24 ENCOUNTER — Other Ambulatory Visit: Payer: Self-pay | Admitting: Family Medicine

## 2018-07-24 DIAGNOSIS — F419 Anxiety disorder, unspecified: Secondary | ICD-10-CM

## 2018-08-24 ENCOUNTER — Other Ambulatory Visit: Payer: Self-pay | Admitting: Physician Assistant

## 2018-08-24 DIAGNOSIS — F419 Anxiety disorder, unspecified: Secondary | ICD-10-CM

## 2018-08-28 ENCOUNTER — Other Ambulatory Visit: Payer: Self-pay | Admitting: Family Medicine

## 2018-08-28 DIAGNOSIS — F419 Anxiety disorder, unspecified: Secondary | ICD-10-CM

## 2018-08-28 NOTE — Telephone Encounter (Signed)
Shay with [facility] called stating that they have sent multiple refill request for pt's ALPRAZolam [name]) 0.5 MG tablet and pt is at the pharmacy to pick up medication. Please advise. Thanks TNP

## 2018-08-28 NOTE — Telephone Encounter (Signed)
[facility] faxed a refill request for the following medication. Thanks CC  ALPRAZolam (XANAX) 0.5 MG tablet

## 2018-09-26 ENCOUNTER — Other Ambulatory Visit: Payer: Self-pay | Admitting: Family Medicine

## 2018-09-26 DIAGNOSIS — F419 Anxiety disorder, unspecified: Secondary | ICD-10-CM

## 2018-10-03 ENCOUNTER — Other Ambulatory Visit: Payer: Self-pay | Admitting: Family Medicine

## 2018-10-03 DIAGNOSIS — F419 Anxiety disorder, unspecified: Secondary | ICD-10-CM

## 2018-10-03 NOTE — Telephone Encounter (Signed)
Needing a refill on:  ALPRAZolam [name]) 0.5 MG tablet   Please fill at:     [facility] [place], [place] - [address] [phone] (Phone) [phone] (Fax)   Pharmacy sent a fax request last week.  Thanks, [employer]

## 2018-10-05 ENCOUNTER — Telehealth: Payer: Self-pay | Admitting: Family Medicine

## 2018-10-05 ENCOUNTER — Ambulatory Visit: Payer: BLUE CROSS/BLUE SHIELD | Admitting: Family Medicine

## 2018-10-05 VITALS — BP 136/80 | HR 94 | Temp 98.5°F | Resp 16 | Wt 170.0 lb

## 2018-10-05 DIAGNOSIS — F419 Anxiety disorder, unspecified: Secondary | ICD-10-CM | POA: Diagnosis not present

## 2018-10-05 DIAGNOSIS — J320 Chronic maxillary sinusitis: Secondary | ICD-10-CM | POA: Diagnosis not present

## 2018-10-05 DIAGNOSIS — F5101 Primary insomnia: Secondary | ICD-10-CM

## 2018-10-05 MED ORDER — AMOXICILLIN-POT CLAVULANATE 875-125 MG PO TABS: 1.0000 | ORAL_TABLET | Freq: Two times a day (BID) | ORAL | 0 refills | Status: DC

## 2018-10-05 MED ORDER — ALPRAZOLAM 0.5 MG PO TABS: 0.5000 mg | ORAL_TABLET | Freq: Every day | ORAL | 5 refills | Status: DC

## 2018-10-05 NOTE — Telephone Encounter (Signed)
Done

## 2018-10-05 NOTE — Telephone Encounter (Signed)
Pharmacy was calling for pt to check if pt has an antibiotic that was supposed to be called in for her. Please call Homero Fellers back at [phone].  Please advise.  Thanks, [employer]

## 2018-10-05 NOTE — Progress Notes (Signed)
Elizabeth Munoz  MRN: [id number] DOB: [birth year]  Subjective:  HPI   The patient is a 52 year old female who presents for evaluation of sinus congestion and pain.  She states that she started having symptoms 5 days ago.  She has significant sinus pain across her eyes and she now has hoarseness.  The patient has also requested refills on her medications.  She was unable to tell me the names of her medications and has several medicines that are for acid reflux.  She states she takes them all.  She also has 2 or 3 medications for sleep listed and she only knows the name for the Alprazolam and not sure of the other medicines.  Patient Active Problem List   Diagnosis Date Noted  . Perimenopause 05/29/2018  . 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 H/O pyloric stenosis   . Menometrorrhagia 06/22/2015  . PMS (premenstrual syndrome) 06/22/2015  . Mood disorder (HCC) 06/22/2015  . Migraine 06/19/2015  . GERD (gastroesophageal reflux disease)     Past Medical History:  Diagnosis Date  . Depression   . GERD (gastroesophageal reflux disease)   . H/O pyloric stenosis    06/24/2015 noted also by Dr. Sullivan Lone  . Insomnia   . Migraine 06/19/2015    Social History   Socioeconomic History  . Marital status: Single    Spouse name: Not on file  . Number of children: Not on file  . Years of education: Not on file  . Highest education level: Not on file  Occupational History  . Not on file  Social Needs  . Financial resource strain: Not on file  . Food insecurity:    Worry: Not on file    Inability: Not on file  . Transportation needs:    Medical: Not on file    Non-medical: Not on file  Tobacco Use  . Smoking status: Never Smoker  . Smokeless tobacco: Never Used  Substance and Sexual Activity  . Alcohol use: No    Alcohol/week: 0.0 standard drinks  . Drug use: No  . Sexual activity: Not Currently    Birth control/protection: Surgical  Lifestyle  . Physical activity:    Days per  week: Not on file    Minutes per session: Not on file  . Stress: Not on file  Relationships  . Social connections:    Talks on phone: Not on file    Gets together: Not on file    Attends religious service: Not on file    Active member of club or organization: Not on file    Attends meetings of clubs or organizations: Not on file    Relationship status: Not on file  . Intimate partner violence:    Fear of current or ex partner: Not on file    Emotionally abused: Not on file    Physically abused: Not on file    Forced sexual activity: Not on file  Other Topics Concern  . Not on file  Social History Narrative  . Not on file    Outpatient Encounter Medications as of 10/05/2018  Medication Sig Note  . ALPRAZolam (XANAX) 0.5 MG tablet Take 1 tablet (0.5 mg total) by mouth daily. Please schedule office visit before future refills   . butalbital-acetaminophen-caffeine (FIORICET, ESGIC) 50-325-40 MG tablet TAKE ONE TO TWO TABLETS EVERY FOUR HOURSAS NEEDED FOR HEADACHE 06/06/2017: As needed  . cetirizine (ZYRTEC) 10 MG tablet Take 1 tablet (10 mg total) by mouth daily.   Marland Kitchen dexlansoprazole (DEXILANT)  60 MG capsule Take by mouth.   . fluticasone (FLONASE) 50 MCG/ACT nasal spray USE 2 SPRAYS EACH NOSTRIL DAILY   . furosemide (LASIX) 20 MG tablet Take by mouth.   . gabapentin (NEURONTIN) 600 MG tablet Take 1 tablet (600 mg total) by mouth at bedtime.   . Multiple Vitamin (MULTIVITAMIN) tablet Take 1 tablet by mouth daily.   Marland Kitchen omeprazole (PRILOSEC) 20 MG capsule Take 1 capsule (20 mg total) by mouth 2 (two) times daily before a meal.   . pantoprazole (PROTONIX) 40 MG tablet Take 1 tablet (40 mg total) by mouth daily.   . polyethylene glycol powder (GLYCOLAX/MIRALAX) powder Take 17 g by mouth daily.   . RABEprazole (ACIPHEX) 20 MG tablet Take 20 mg by mouth 2 (two) times daily.   . sertraline (ZOLOFT) 100 MG tablet Take 1 tablet (100 mg total) by mouth daily.   . sucralfate (CARAFATE) 1 g tablet  TAKE ONE (1) TABLET FOUR (4) TIMES PER DAY -WITH MEALS AND AT BEDTIME   . SUMAtriptan (IMITREX) 50 MG tablet Take by mouth.   . temazepam (RESTORIL) 30 MG capsule TAKE 1 CAPSULE AT BEDTIME AS NEEDED FOR SLEEP   . Verapamil HCl CR 200 MG CP24 Take 1 capsule (200 mg total) by mouth daily.   . traZODone (DESYREL) 100 MG tablet Take 1 tablet (100 mg total) by mouth at bedtime. (Patient not taking: Reported on 10/05/2018)   . [DISCONTINUED] azithromycin (ZITHROMAX) 250 MG tablet Take 2 tablets today, then 1 tablet daily thereafter.   . [DISCONTINUED] HYDROcodone-homatropine (HYCODAN) 5-1.5 MG/5ML syrup Take 5 mLs by mouth every 6 (six) hours as needed for cough.    No facility-administered encounter medications on file as of 10/05/2018.     Allergies  Allergen Reactions  . Imitrex [Sumatriptan]     Stomach pain, nausea, vomiting, felt bad    Review of Systems  Constitutional: Negative for fever.  HENT: Positive for congestion, sinus pain and sore throat (dry). Negative for ear discharge, ear pain, hearing loss, nosebleeds and tinnitus.   Eyes: Negative.   Respiratory: Positive for shortness of breath. Negative for cough and wheezing. Sputum production: with exertion.   Cardiovascular: Negative for chest pain and palpitations.  Gastrointestinal: Negative.   Skin: Negative.   Endo/Heme/Allergies: Negative.   Psychiatric/Behavioral: Negative.     Objective:  BP 136/80 (BP Location: Right Arm, Patient Position: Sitting, Cuff Size: Normal)   Pulse 94   Temp 98.5 F (36.9 C) (Oral)   Resp 16   Wt 170 lb (77.1 kg)   BMI 29.18 kg/m   Physical Exam  Constitutional: She is oriented to person, place, and time and well-developed, well-nourished, and in no distress.  HENT:  Head: Normocephalic and atraumatic.  Right Ear: External ear normal.  Left Ear: External ear normal.  Nose: Nose normal.  Mouth/Throat: Oropharynx is clear and moist. No oropharyngeal exudate.  Mild maxillary sinus  tenderness.  Eyes: Conjunctivae are normal. No scleral icterus.  Neck: No thyromegaly present.  Cardiovascular: Normal rate, regular rhythm and normal heart sounds.  Pulmonary/Chest: Effort normal and breath sounds normal.  Abdominal: Soft.  Musculoskeletal: She exhibits no edema.  Neurological: She is alert and oriented to person, place, and time. Gait normal. GCS score is 15.  Skin: Skin is warm and dry.  Psychiatric: Mood, memory, affect and judgment normal.    Assessment and Plan :  1. Anxiety Her Mother died earlier this year and I advised pt it is time to start  cutting back on Xanax use. - ALPRAZolam (XANAX) 0.5 MG tablet; Take 1 tablet (0.5 mg total) by mouth daily. Please schedule office visit before future refills  Dispense: 30 tablet; Refill: 5  2. Maxillary sinusitis, unspecified chronicity  - amoxicillin-clavulanate (AUGMENTIN) 875-125 MG tablet; Take 1 tablet by mouth 2 (two) times daily.  Dispense: 20 tablet; Refill: 0 3.chronic Insomnia Stop Temazepam.  I have done the exam and reviewed the chart and it is accurate to the best of my knowledge. Dentist has been used and  any errors in dictation or transcription are unintentional. Julieanne Manson M.D. [facility]

## 2018-10-09 ENCOUNTER — Other Ambulatory Visit: Payer: Self-pay | Admitting: Family Medicine

## 2018-10-09 DIAGNOSIS — J0101 Acute recurrent maxillary sinusitis: Secondary | ICD-10-CM

## 2018-10-09 DIAGNOSIS — F4323 Adjustment disorder with mixed anxiety and depressed mood: Secondary | ICD-10-CM

## 2018-10-09 NOTE — Telephone Encounter (Signed)
Pt was in last Thursday and seen Dr. Sullivan Lone.  She states that she told him and the nurse she needs refill on all her medications.  She called the pharmacy this weekend and told her nothing has been sent in .    She uses [facility]

## 2018-10-19 ENCOUNTER — Ambulatory Visit (INDEPENDENT_AMBULATORY_CARE_PROVIDER_SITE_OTHER): Payer: BLUE CROSS/BLUE SHIELD

## 2018-10-19 DIAGNOSIS — Z23 Encounter for immunization: Secondary | ICD-10-CM | POA: Diagnosis not present

## 2018-10-26 ENCOUNTER — Other Ambulatory Visit: Payer: Self-pay | Admitting: Family Medicine

## 2018-10-26 DIAGNOSIS — R1010 Upper abdominal pain, unspecified: Secondary | ICD-10-CM

## 2018-12-07 ENCOUNTER — Telehealth: Payer: Self-pay

## 2018-12-07 NOTE — Telephone Encounter (Signed)
Please review. Thanks!  

## 2018-12-07 NOTE — Telephone Encounter (Signed)
Patient is calling the office with complaints of pain weakness in her lower extremities and difficulty walking. Patient states that she has seen Dr. Sullivan LoneGilbert for this before and pain is getting worse and wants to discuss with doctor of what she can do next? I explained to patient that Dr. Sullivan LoneGilbert does not have any openings today or Monday and will be out of office for the holidays. I tried offering patient appt with P.A tomorrow she declined and stated that she only wanted to see a M.D. I tried to arrange appt with Dr. Leonard SchwartzB on Monday since patient states that she will be with son tomorrow at hospital for surgery but she declined. Patient is requesting that Dr. Sullivan LoneGilbert or his CMA give her a call back to see if she can be squeezed in on schedule for tomorrow, patient states that she will not see anyne but Dr. Sullivan LoneGilbert. Please advise, patient states that if you can not reach her you can reach her husband Sena HitchGary Walters at [phone]. KW

## 2018-12-22 ENCOUNTER — Other Ambulatory Visit: Payer: Self-pay

## 2018-12-22 ENCOUNTER — Encounter: Payer: Self-pay | Admitting: Family Medicine

## 2018-12-22 ENCOUNTER — Ambulatory Visit (INDEPENDENT_AMBULATORY_CARE_PROVIDER_SITE_OTHER): Payer: BLUE CROSS/BLUE SHIELD | Admitting: Family Medicine

## 2018-12-22 VITALS — BP 146/100 | HR 100 | Temp 98.4°F | Ht 64.0 in | Wt 180.0 lb

## 2018-12-22 DIAGNOSIS — M722 Plantar fascial fibromatosis: Secondary | ICD-10-CM | POA: Diagnosis not present

## 2018-12-22 MED ORDER — MELOXICAM 15 MG PO TABS: 15.0000 mg | ORAL_TABLET | Freq: Every day | ORAL | 0 refills | Status: DC

## 2018-12-22 NOTE — Progress Notes (Signed)
  Subjective:     Patient ID: Elizabeth Munoz, female   DOB: August 17, 1966, 53 y.o.   MRN: [id number] Chief Complaint  Patient presents with  . Foot Pain    right heel since 12/01/18   HPI States it hurts with w.b., particularly when she first gets up from sitting. 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 Reports she wears shoes with good support at work. Has not taken any medication for this. There is a mild communication barrier.  Review of Systems     Objective:   Physical Exam Constitutional:      General: She is not in acute distress.    Appearance: She is not ill-appearing.  Cardiovascular:     Pulses:          Dorsalis pedis pulses are 2+ on the right side.       Posterior tibial pulses are 2+ on the right side.  Musculoskeletal:     Comments: Tender @ right plantar heel. DF/PF 5/5        Assessment:    1. Plantar fasciitis of right foot: meloxicam 15 mg. - Ambulatory referral to Podiatry    Plan:    Recommended gel shoe inserts.

## 2018-12-22 NOTE — Patient Instructions (Signed)
Add a gel shoe insert to both shoes. We will call you about the podiatrist referral.

## 2018-12-29 ENCOUNTER — Encounter: Payer: Self-pay | Admitting: Family Medicine

## 2018-12-29 ENCOUNTER — Other Ambulatory Visit: Payer: Self-pay

## 2018-12-29 ENCOUNTER — Ambulatory Visit: Payer: BLUE CROSS/BLUE SHIELD | Admitting: Family Medicine

## 2018-12-29 VITALS — BP 136/80 | HR 96 | Temp 98.1°F | Ht 64.0 in | Wt 180.0 lb

## 2018-12-29 DIAGNOSIS — J069 Acute upper respiratory infection, unspecified: Secondary | ICD-10-CM | POA: Diagnosis not present

## 2018-12-29 NOTE — Patient Instructions (Addendum)
Discussed use of Mucinex D for congestion, Delsym for cough, and Benadryl for postnasal drainage. Let me know if sinuses are not improving by Tuesday of next week.

## 2018-12-29 NOTE — Progress Notes (Signed)
  Subjective:     Patient ID: Elizabeth Munoz, female   DOB: 06-04-1966, 53 y.o.   MRN: [id number] Chief Complaint  Patient presents with  . Headache    since 12/26/18  . Nasal Congestion    with bloody mucus  . sinus pressure   HPI Also reports that she is hoarse with PND but no fever. Has been taking Aleve sinus product for her sx.  Review of Systems     Objective:   Physical Exam Constitutional:      General: She is not in acute distress.    Appearance: She is not ill-appearing.  Neurological:     Mental Status: She is alert.   Ears: T.M's intact without inflammation Sinuses: mild maxillary sinus tenderness Throat: no tonsillar enlargement or exudate Neck: no cervical adenopathy Lungs: clear     Assessment:    1. URI, acute     Plan:    Discussed use of Mucinex D for congestion, Delsym for cough, and Benadryl for postnasal drainage. Work excuse for 1/131/14/20. Call if sinuses not improving by early next week.

## 2019-01-01 ENCOUNTER — Telehealth: Payer: Self-pay

## 2019-01-01 ENCOUNTER — Telehealth: Payer: Self-pay | Admitting: Family Medicine

## 2019-01-01 DIAGNOSIS — J320 Chronic maxillary sinusitis: Secondary | ICD-10-CM

## 2019-01-01 MED ORDER — AMOXICILLIN-POT CLAVULANATE 875-125 MG PO TABS: 1.0000 | ORAL_TABLET | Freq: Two times a day (BID) | ORAL | 0 refills | Status: DC

## 2019-01-01 NOTE — Telephone Encounter (Signed)
Patient has called again wanting to know if she can get an antibiotic called in.

## 2019-01-01 NOTE — Telephone Encounter (Signed)
Please advise 

## 2019-01-01 NOTE — Telephone Encounter (Signed)
Augmentin since she saw Nadine Counts a few days ago.

## 2019-01-01 NOTE — Telephone Encounter (Signed)
done

## 2019-01-01 NOTE — Telephone Encounter (Signed)
Patient was seen on Friday 12/29/18 and reports that she needs something called in for sinus infection. Patient reports symptoms are worsening. Patient reports no voice, head ache, cough, runny nose, yellow mucus. Mucinex not working, causing abdominal pain. Patient is requesting Dr. Sullivan Lone call something in for patient. sd CB # [phone] [facility]

## 2019-01-03 ENCOUNTER — Telehealth: Payer: Self-pay | Admitting: Family Medicine

## 2019-01-03 ENCOUNTER — Other Ambulatory Visit: Payer: Self-pay

## 2019-01-03 DIAGNOSIS — J320 Chronic maxillary sinusitis: Secondary | ICD-10-CM

## 2019-01-03 MED ORDER — AMOXICILLIN-POT CLAVULANATE 875-125 MG PO TABS: 1.0000 | ORAL_TABLET | Freq: Two times a day (BID) | ORAL | 0 refills | Status: DC

## 2019-01-03 NOTE — Telephone Encounter (Signed)
Pt's husband says [facility] is stating they haven't received the request for amoxicillin-clavulanate (AUGMENTIN) 875-125 MG tablet To be filled.  Please have pt's Rx called in asap and call husband to let him know it was called into:  [facility] [address] [place], [place] - [address] [phone] (Phone) [phone] (Fax)   Thanks, [employer]

## 2019-03-06 ENCOUNTER — Other Ambulatory Visit: Payer: Self-pay | Admitting: Family Medicine

## 2019-03-07 ENCOUNTER — Other Ambulatory Visit: Payer: Self-pay | Admitting: Family Medicine

## 2019-03-07 DIAGNOSIS — F419 Anxiety disorder, unspecified: Secondary | ICD-10-CM

## 2019-03-07 DIAGNOSIS — J0101 Acute recurrent maxillary sinusitis: Secondary | ICD-10-CM

## 2019-03-07 DIAGNOSIS — R1010 Upper abdominal pain, unspecified: Secondary | ICD-10-CM

## 2019-03-07 NOTE — Telephone Encounter (Signed)
Pt called saying she is having a little cough with clear mucus in the morning, no fever.  Taking Delsem  Please Advise  [phone]  Thanks Barth Kirks

## 2019-03-07 NOTE — Telephone Encounter (Signed)
LMTCB ED 

## 2019-03-07 NOTE — Telephone Encounter (Signed)
Please review

## 2019-03-07 NOTE — Telephone Encounter (Signed)
Robitussin DM,fluids,rest,tylenol.

## 2019-03-08 NOTE — Telephone Encounter (Signed)
LMTCB

## 2019-03-09 NOTE — Telephone Encounter (Signed)
Patient advised as below. Patient would also like to get a refill on Flonase and Zyrtec. She states her insurance will cover both medications with a prescription. She also states she needs a refill on some of her other medications (which I have requested refills on). [facility].

## 2019-03-12 MED ORDER — POLYETHYLENE GLYCOL 3350 17 GM/SCOOP PO POWD: 17.0000 g | Freq: Every day | ORAL | 5 refills | Status: DC

## 2019-03-12 MED ORDER — SUCRALFATE 1 G PO TABS: ORAL_TABLET | ORAL | 4 refills | Status: DC

## 2019-03-12 MED ORDER — ALPRAZOLAM 0.5 MG PO TABS: 0.5000 mg | ORAL_TABLET | Freq: Every day | ORAL | 0 refills | Status: DC

## 2019-03-12 MED ORDER — CETIRIZINE HCL 10 MG PO TABS: 10.0000 mg | ORAL_TABLET | Freq: Every day | ORAL | 11 refills | Status: DC

## 2019-03-12 MED ORDER — FLUTICASONE PROPIONATE 50 MCG/ACT NA SUSP: NASAL | 11 refills | Status: DC

## 2019-05-12 ENCOUNTER — Other Ambulatory Visit: Payer: Self-pay | Admitting: Family Medicine

## 2019-05-12 DIAGNOSIS — F419 Anxiety disorder, unspecified: Secondary | ICD-10-CM

## 2019-05-17 ENCOUNTER — Telehealth: Payer: Self-pay

## 2019-05-17 NOTE — Telephone Encounter (Signed)
She does at [facility] so can schedule herself. Due in June. Pls advise pt.

## 2019-05-17 NOTE — Telephone Encounter (Signed)
Called pt, no answer, LVMTRC. 

## 2019-05-17 NOTE — Telephone Encounter (Signed)
Pt called triage line stating she needs an order for her mammogram. Please call her to notify her when it is done. Thanks [phone]

## 2019-05-17 NOTE — Telephone Encounter (Signed)
Pt aware.

## 2019-05-23 ENCOUNTER — Ambulatory Visit (INDEPENDENT_AMBULATORY_CARE_PROVIDER_SITE_OTHER): Payer: BLUE CROSS/BLUE SHIELD | Admitting: Obstetrics and Gynecology

## 2019-05-23 ENCOUNTER — Ambulatory Visit: Payer: BLUE CROSS/BLUE SHIELD | Admitting: Obstetrics and Gynecology

## 2019-05-23 ENCOUNTER — Telehealth: Payer: Self-pay

## 2019-05-23 ENCOUNTER — Encounter: Payer: Self-pay | Admitting: Obstetrics and Gynecology

## 2019-05-23 ENCOUNTER — Other Ambulatory Visit: Payer: Self-pay

## 2019-05-23 DIAGNOSIS — N951 Menopausal and female climacteric states: Secondary | ICD-10-CM

## 2019-05-23 DIAGNOSIS — G43839 Menstrual migraine, intractable, without status migrainosus: Secondary | ICD-10-CM

## 2019-05-23 MED ORDER — RIZATRIPTAN BENZOATE 10 MG PO TABS: ORAL_TABLET | ORAL | 0 refills | Status: DC

## 2019-05-23 NOTE — Telephone Encounter (Signed)
Pt calling; has appt at 9:30 for telephone conference; doesn't know if we will call her or if she needs to call us.  [phone]  Pt's appt is not at 1:50

## 2019-05-23 NOTE — Telephone Encounter (Signed)
930 appt was suppose to be a telephone consultation but was not noted. Appt is scheuduled for this afternoon as a telephone visit.

## 2019-05-23 NOTE — Patient Instructions (Signed)
I value your feedback and entrusting us with your care. If you get a [facility] patient survey, I would appreciate you taking the time to let us know about your experience today. Thank you! 

## 2019-05-23 NOTE — Progress Notes (Signed)
Virtual Visit via Telephone Note  I connected with Elizabeth Munoz on 05/23/19 at  1:50 PM EDT by telephone and verified that I am speaking with the correct person using two identifiers.   I discussed the limitations, risks, security and privacy concerns of performing an evaluation and management service by telephone and the availability of in person appointments. I also discussed with the patient that there may be a patient responsible charge related to this service. The patient expressed understanding and agreed to proceed. Location of pt: home. Husband on call too. [facility] Name of CMA for history intake: Donnetta HailGrecia Arroyo    Chief Complaint  Patient presents with  . Advice Only    HPI:      [name] is a 53 y.o. [id number] who LMP was No LMP recorded., presents today for hyst conf. Pt is perimenopausal, starting to skip months with her menses. When she does have a period, it lasts about 10 days, 4-5 days with heavier flow and 5-6 days spotting afterwards. This has been going on for a couple yrs. Her biggest issue is menstrual migraines with aura that can last 4-5 days. She tries OTC headache meds and fioricet without sx relief. She took imitrex in past with GI side effects, but hasn't tried any other meds. She wonders if having a hyst would stop her migraines. Has been seen by ENT in past for headaches. She is having vasomotor sx as well.  Last annual 6/19, has appt 7/20.    Past Medical History:  Diagnosis Date  . Depression   . GERD (gastroesophageal reflux disease)   . H/O pyloric stenosis    06/24/2015 noted also by Dr. Sullivan LoneGilbert  . Insomnia   . Migraine 06/19/2015    Past Surgical History:  Procedure Laterality Date  . BREAST REDUCTION SURGERY    . CESAREAN SECTION     x 2  . TUBAL LIGATION    . UPPER GI ENDOSCOPY     chronic gastritis, LA esophagitis    Family History  Problem Relation Age of Onset  . Alzheimer's  disease Mother   . Diabetes Father   . Prostate cancer Father   . Diabetes Brother      Current Outpatient Medications:  .  ALPRAZolam (XANAX) 0.5 MG tablet, TAKE ONE TABLET EVERY DAY NEEDS APPT FORMORE REFILLS, Disp: 30 tablet, Rfl: 0 .  butalbital-acetaminophen-caffeine (FIORICET, ESGIC) 50-325-40 MG tablet, TAKE ONE TO TWO TABLETS EVERY FOUR HOURSAS NEEDED FOR HEADACHE, Disp: 35 tablet, Rfl: 3 .  cetirizine (ZYRTEC) 10 MG tablet, Take 1 tablet (10 mg total) by mouth daily., Disp: 30 tablet, Rfl: 11 .  dexlansoprazole (DEXILANT) 60 MG capsule, Take by mouth., Disp: , Rfl:  .  fluticasone (FLONASE) 50 MCG/ACT nasal spray, USE 2 SPRAYS EACH NOSTRIL DAILY, Disp: 16 g, Rfl: 11 .  furosemide (LASIX) 20 MG tablet, Take by mouth., Disp: , Rfl:  .  gabapentin (NEURONTIN) 600 MG tablet, TAKE 1 TABLET BY MOUTH AT BEDTIME, Disp: 90 tablet, Rfl: 1 .  meloxicam (MOBIC) 15 MG tablet, Take 1 tablet (15 mg total) by mouth daily., Disp: 30 tablet, Rfl: 0 .  Multiple Vitamin (MULTIVITAMIN) tablet, Take 1 tablet by mouth daily., Disp: , Rfl:  .  omeprazole (PRILOSEC) 20 MG capsule, Take 1 capsule (20 mg total) by mouth 2 (two) times daily before a meal., Disp: 60 capsule, Rfl: 1 .  pantoprazole (PROTONIX) 40 MG tablet, Take 1 tablet (40  mg total) by mouth daily., Disp: 30 tablet, Rfl: 5 .  polyethylene glycol powder (GLYCOLAX/MIRALAX) powder, Take 17 g by mouth daily., Disp: 3350 g, Rfl: 5 .  RABEprazole (ACIPHEX) 20 MG tablet, Take 20 mg by mouth 2 (two) times daily., Disp: , Rfl:  .  sertraline (ZOLOFT) 100 MG tablet, TAKE ONE TABLET BY MOUTH DAILY, Disp: 90 tablet, Rfl: 2 .  sucralfate (CARAFATE) 1 g tablet, TAKE ONE (1) TABLET FOUR (4) TIMES PER DAY -WITH MEALS AND AT BEDTIME, Disp: 240 tablet, Rfl: 4 .  Verapamil HCl CR 200 MG CP24, Take 1 capsule (200 mg total) by mouth daily., Disp: 30 each, Rfl: 12 .  rizatriptan (MAXALT) 10 MG tablet, Take 1 tab at onset of headache, May repeat in 2 hours if needed;  max up to 30 mg daily, Disp: 10 tablet, Rfl: 0   Assessment/Plan: Intractable menstrual migraine without status migrainosus - Plan: rizatriptan (MAXALT) 10 MG tablet  Perimenopause  Perimenopausal vasomotor symptoms  Discussed course of perimenopause/menopause. Discussed pros/cons of hyst. If pt's primary reason is for headache resolution, she would need BSO, too. Given multiple headache meds out there, suggested she try HA medication first since she is close to menopause, which will most likely improve headaches. Rx maxalt to try. If doesn't work, f/u with PCP for other tx options, possible neuro ref.  Hyst not indicated for her perimenopausal bleeding at this time. Has longer periods but not excessively heavy/painful. As for vasomotor sx, pt will have them regardless of hyst and discussed HRT for sx.  Pt happy with discussion and will consider options.  Wants to f/u further at 7/20 annual.    Meds ordered this encounter  Medications  . rizatriptan (MAXALT) 10 MG tablet    Sig: Take 1 tab at onset of headache, May repeat in 2 hours if needed; max up to 30 mg daily    Dispense:  10 tablet    Refill:  0    Order Specific Question:   Supervising Provider    Answer:   Nadara Mustard [id number]    I provided 26 minutes of non-face-to-face time during this encounter.   Alicia B. Copland, PA-C 05/23/2019 4:50 PM

## 2019-05-24 ENCOUNTER — Ambulatory Visit: Payer: BLUE CROSS/BLUE SHIELD | Admitting: Family Medicine

## 2019-05-24 ENCOUNTER — Other Ambulatory Visit: Payer: Self-pay

## 2019-05-24 ENCOUNTER — Encounter: Payer: Self-pay | Admitting: Family Medicine

## 2019-05-24 VITALS — BP 108/68 | HR 100 | Temp 98.8°F | Resp 16 | Wt 180.0 lb

## 2019-05-24 DIAGNOSIS — N951 Menopausal and female climacteric states: Secondary | ICD-10-CM

## 2019-05-24 DIAGNOSIS — Z8669 Personal history of other diseases of the nervous system and sense organs: Secondary | ICD-10-CM | POA: Diagnosis not present

## 2019-05-24 DIAGNOSIS — F4323 Adjustment disorder with mixed anxiety and depressed mood: Secondary | ICD-10-CM

## 2019-05-24 DIAGNOSIS — K219 Gastro-esophageal reflux disease without esophagitis: Secondary | ICD-10-CM | POA: Diagnosis not present

## 2019-05-24 DIAGNOSIS — M722 Plantar fascial fibromatosis: Secondary | ICD-10-CM

## 2019-05-24 MED ORDER — NAPROXEN 500 MG PO TABS: 500.0000 mg | ORAL_TABLET | Freq: Two times a day (BID) | ORAL | 1 refills | Status: DC

## 2019-05-24 NOTE — Patient Instructions (Signed)
Discontinue Meloxicam.

## 2019-05-24 NOTE — Progress Notes (Signed)
Patient: Elizabeth Munoz Female    DOB: 1966-04-27   53 y.o.   MRN: [id number] Visit Date: 05/24/2019  Today's Provider: Megan Mansichard Priyah Schmuck Jr, MD   Chief Complaint  Patient presents with  . Ankle Pain   Subjective:     HPI  Patient comes in today c/o right ankle pain. She reports that this is an ongoing issue. She reports that she was referred to podiatry but was unable to go because of COVID. She reports that the pain is constant. She has not taken anything OTC for her symptoms. She does mention that she has swelling at times, but it goes away throughout the day.   She still sees Gyn and still having menses. She has been having "menstrual migraines." BP Readings from Last 3 Encounters:  05/24/19 108/68  12/29/18 136/80  12/22/18 (!) 146/100   Wt Readings from Last 3 Encounters:  05/24/19 180 lb (81.6 kg)  12/29/18 180 lb (81.6 kg)  12/22/18 180 lb (81.6 kg)     Allergies  Allergen Reactions  . Imitrex [Sumatriptan]     Stomach pain, nausea, vomiting, felt bad     Current Outpatient Medications:  .  ALPRAZolam (XANAX) 0.5 MG tablet, TAKE ONE TABLET EVERY DAY NEEDS APPT FORMORE REFILLS, Disp: 30 tablet, Rfl: 0 .  butalbital-acetaminophen-caffeine (FIORICET, ESGIC) 50-325-40 MG tablet, TAKE ONE TO TWO TABLETS EVERY FOUR HOURSAS NEEDED FOR HEADACHE, Disp: 35 tablet, Rfl: 3 .  cetirizine (ZYRTEC) 10 MG tablet, Take 1 tablet (10 mg total) by mouth daily., Disp: 30 tablet, Rfl: 11 .  dexlansoprazole (DEXILANT) 60 MG capsule, Take by mouth., Disp: , Rfl:  .  fluticasone (FLONASE) 50 MCG/ACT nasal spray, USE 2 SPRAYS EACH NOSTRIL DAILY, Disp: 16 g, Rfl: 11 .  furosemide (LASIX) 20 MG tablet, Take by mouth., Disp: , Rfl:  .  gabapentin (NEURONTIN) 600 MG tablet, TAKE 1 TABLET BY MOUTH AT BEDTIME, Disp: 90 tablet, Rfl: 1 .  meloxicam (MOBIC) 15 MG tablet, Take 1 tablet (15 mg total) by mouth daily., Disp: 30 tablet, Rfl: 0 .  Multiple Vitamin (MULTIVITAMIN) tablet, Take 1  tablet by mouth daily., Disp: , Rfl:  .  omeprazole (PRILOSEC) 20 MG capsule, Take 1 capsule (20 mg total) by mouth 2 (two) times daily before a meal., Disp: 60 capsule, Rfl: 1 .  pantoprazole (PROTONIX) 40 MG tablet, Take 1 tablet (40 mg total) by mouth daily., Disp: 30 tablet, Rfl: 5 .  polyethylene glycol powder (GLYCOLAX/MIRALAX) powder, Take 17 g by mouth daily., Disp: 3350 g, Rfl: 5 .  RABEprazole (ACIPHEX) 20 MG tablet, Take 20 mg by mouth 2 (two) times daily., Disp: , Rfl:  .  rizatriptan (MAXALT) 10 MG tablet, Take 1 tab at onset of headache, May repeat in 2 hours if needed; max up to 30 mg daily, Disp: 10 tablet, Rfl: 0 .  sertraline (ZOLOFT) 100 MG tablet, TAKE ONE TABLET BY MOUTH DAILY, Disp: 90 tablet, Rfl: 2 .  sucralfate (CARAFATE) 1 g tablet, TAKE ONE (1) TABLET FOUR (4) TIMES PER DAY -WITH MEALS AND AT BEDTIME, Disp: 240 tablet, Rfl: 4 .  Verapamil HCl CR 200 MG CP24, Take 1 capsule (200 mg total) by mouth daily., Disp: 30 each, Rfl: 12  Review of Systems  Constitutional: Positive for activity change and fatigue.  Respiratory: Negative.   Cardiovascular: Negative.   Musculoskeletal: Positive for arthralgias and myalgias. Negative for back pain, gait problem and joint swelling.  Skin: Negative for color  change, pallor, rash and wound.  Allergic/Immunologic: Negative.   Neurological: Negative for weakness and numbness.  Psychiatric/Behavioral: Negative.     Social History   Tobacco Use  . Smoking status: Never Smoker  . Smokeless tobacco: Never Used  Substance Use Topics  . Alcohol use: No    Alcohol/week: 0.0 standard drinks      Objective:   BP 108/68 (BP Location: Left Arm, Patient Position: Sitting, Cuff Size: Large)   Pulse 100   Temp 98.8 F (37.1 C)   Resp 16   Wt 180 lb (81.6 kg) Comment: per patient  SpO2 99%   BMI 30.90 kg/m  Vitals:   05/24/19 0947  BP: 108/68  Pulse: 100  Resp: 16  Temp: 98.8 F (37.1 C)  SpO2: 99%  Weight: 180 lb (81.6 kg)      Physical Exam Vitals signs reviewed.  Constitutional:      Appearance: Normal appearance.  HENT:     Head: Normocephalic and atraumatic.     Right Ear: External ear normal.     Left Ear: External ear normal.     Nose: Nose normal.  Eyes:     General: No scleral icterus. Cardiovascular:     Rate and Rhythm: Normal rate and regular rhythm.     Heart sounds: Normal heart sounds.  Pulmonary:     Breath sounds: Normal breath sounds.  Abdominal:     Palpations: Abdomen is soft.  Musculoskeletal:     Comments: Mildly tender over insertion of right plantar fascia to calcaneus and lateral margin of right hind foot.  Skin:    General: Skin is warm and dry.  Neurological:     General: No focal deficit present.     Mental Status: She is alert and oriented to person, place, and time.  Psychiatric:        Mood and Affect: Mood normal.        Behavior: Behavior normal.        Thought Content: Thought content normal.        Judgment: Judgment normal.         Assessment & Plan    1. Plantar fasciitis of right foot Treat for 1 month--pt instructed in shoe wear and refer to podiatry. - naproxen (NAPROSYN) 500 MG tablet; Take 1 tablet (500 mg total) by mouth 2 (two) times daily with a meal.  Dispense: 60 tablet; Refill: 1 - Ambulatory referral to Podiatry  2. Perimenopause Per Gyn.  3. Hx of migraine headaches Stated on Maxalt and Fiorinal.  4. Gastroesophageal reflux disease, esophagitis presence not specified   5. Perimenopausal vasomotor symptoms   6. Adjustment disorder with mixed anxiety and depressed mood Try to eliminate Xanax.     Izear Pine Wendelyn Breslow, MD  [facility]

## 2019-05-28 ENCOUNTER — Other Ambulatory Visit: Payer: Self-pay | Admitting: Family Medicine

## 2019-05-28 DIAGNOSIS — A048 Other specified bacterial intestinal infections: Secondary | ICD-10-CM

## 2019-05-28 MED ORDER — OMEPRAZOLE 20 MG PO CPDR: 20.0000 mg | DELAYED_RELEASE_CAPSULE | Freq: Two times a day (BID) | ORAL | 4 refills | Status: DC

## 2019-05-28 NOTE — Telephone Encounter (Signed)
[facility] faxed refill request for the following medications:  omeprazole (PRILOSEC) 20 MG capsule   Please advise. Thanks TNP

## 2019-05-28 NOTE — Telephone Encounter (Signed)
Please review. Thanks!  

## 2019-06-05 ENCOUNTER — Other Ambulatory Visit: Payer: Self-pay

## 2019-06-05 MED ORDER — GABAPENTIN 600 MG PO TABS: 600.0000 mg | ORAL_TABLET | Freq: Every day | ORAL | 1 refills | Status: DC

## 2019-06-05 NOTE — Telephone Encounter (Signed)
Patient is requesting a refill on her Gabapentin. L.O.V. 05/24/2019, please advise.

## 2019-06-12 ENCOUNTER — Encounter: Payer: Self-pay | Admitting: Obstetrics and Gynecology

## 2019-07-02 ENCOUNTER — Ambulatory Visit: Payer: BLUE CROSS/BLUE SHIELD | Admitting: Obstetrics and Gynecology

## 2019-07-06 ENCOUNTER — Ambulatory Visit: Payer: BLUE CROSS/BLUE SHIELD | Admitting: Obstetrics and Gynecology

## 2019-08-27 ENCOUNTER — Other Ambulatory Visit: Payer: Self-pay | Admitting: Family Medicine

## 2019-08-27 DIAGNOSIS — F419 Anxiety disorder, unspecified: Secondary | ICD-10-CM

## 2019-10-13 ENCOUNTER — Other Ambulatory Visit: Payer: Self-pay | Admitting: Family Medicine

## 2019-10-13 DIAGNOSIS — F4323 Adjustment disorder with mixed anxiety and depressed mood: Secondary | ICD-10-CM

## 2019-10-15 ENCOUNTER — Other Ambulatory Visit: Payer: Self-pay | Admitting: Obstetrics and Gynecology

## 2019-10-15 DIAGNOSIS — G43839 Menstrual migraine, intractable, without status migrainosus: Secondary | ICD-10-CM

## 2019-10-15 MED ORDER — RIZATRIPTAN BENZOATE 10 MG PO TABS: ORAL_TABLET | ORAL | 0 refills | Status: DC

## 2019-10-15 NOTE — Progress Notes (Signed)
Rx RF rizatriptan prn menstrual migraines.

## 2019-10-24 ENCOUNTER — Other Ambulatory Visit: Payer: Self-pay

## 2019-10-24 ENCOUNTER — Ambulatory Visit (INDEPENDENT_AMBULATORY_CARE_PROVIDER_SITE_OTHER): Payer: BLUE CROSS/BLUE SHIELD

## 2019-10-24 ENCOUNTER — Telehealth: Payer: Self-pay

## 2019-10-24 DIAGNOSIS — Z23 Encounter for immunization: Secondary | ICD-10-CM | POA: Diagnosis not present

## 2019-10-24 NOTE — Telephone Encounter (Signed)
Patient called office because she is scheduled this afternoon for a flu shot and she wanted to know if she would be due for a pneumonia shot at time of visit? KW

## 2019-10-24 NOTE — Telephone Encounter (Signed)
Called and notified the patient that she does not yet meet the requirements for the Pneumonia shot until 18 years or Immunocompromised but she will still come in for her Flu Shot. She also had questions about her Sertraline possibly being out. It looks like we sent in a refill 10/14/19 for a 6 month supply. She will bring her bottle when she comes in for her Flu Shot.

## 2019-10-25 ENCOUNTER — Other Ambulatory Visit: Payer: Self-pay | Admitting: Family Medicine

## 2019-10-25 NOTE — Telephone Encounter (Signed)
[facility] faxed refill request for the following medications:  Verapamil HCl CR 200 MG CP24    Please advise.

## 2019-10-29 MED ORDER — VERAPAMIL HCL ER 200 MG PO CP24: 1.0000 | ORAL_CAPSULE | Freq: Every day | ORAL | 5 refills | Status: DC

## 2019-10-29 NOTE — Telephone Encounter (Signed)
Medication was sent into the pharmacy.  

## 2019-10-29 NOTE — Telephone Encounter (Signed)
Ok to rf this rx.

## 2019-10-31 ENCOUNTER — Encounter: Payer: Self-pay | Admitting: Family Medicine

## 2019-10-31 ENCOUNTER — Ambulatory Visit (INDEPENDENT_AMBULATORY_CARE_PROVIDER_SITE_OTHER): Payer: BLUE CROSS/BLUE SHIELD | Admitting: Family Medicine

## 2019-10-31 ENCOUNTER — Telehealth: Payer: Self-pay

## 2019-10-31 ENCOUNTER — Other Ambulatory Visit: Payer: Self-pay

## 2019-10-31 VITALS — BP 129/82 | HR 90 | Temp 97.8°F | Resp 16 | Wt 180.0 lb

## 2019-10-31 DIAGNOSIS — F39 Unspecified mood [affective] disorder: Secondary | ICD-10-CM

## 2019-10-31 DIAGNOSIS — H6690 Otitis media, unspecified, unspecified ear: Secondary | ICD-10-CM

## 2019-10-31 MED ORDER — AZITHROMYCIN 250 MG PO TABS: ORAL_TABLET | ORAL | 0 refills | Status: DC

## 2019-10-31 NOTE — Telephone Encounter (Signed)
Patient called saying that she has seen blood coming out of her right ear last night. She has not seen anything since then. She is having a headache, but reports that she always have migraine headaches. She is concerned that her eardrum could be infected. She denies any injuries. She has not used anything OTC for her symptoms. Appt was scheduled for evaluation.

## 2019-10-31 NOTE — Progress Notes (Signed)
Patient: Elizabeth Munoz Female    DOB: [birth year]   53 y.o.   MRN: [id number] Visit Date: 10/31/2019  Today's Provider: Wilhemena Durie, MD   Chief Complaint  Patient presents with  . Ear Fullness   Subjective:   HPI Patient comes in today c/o that she has seen blood coming out of her right ear last night. She has not seen anything since then. She is having a headache, but reports that she always have migraine headaches. She is concerned that her eardrum could be infected. She denies any injuries. She has not used anything OTC for her symptoms.No fever. She has chronic anxiety.  BP Readings from Last 3 Encounters:  10/31/19 129/82  05/24/19 108/68  12/29/18 136/80    Allergies  Allergen Reactions  . Imitrex [Sumatriptan]     Stomach pain, nausea, vomiting, felt bad     Current Outpatient Medications:  .  ALPRAZolam (XANAX) 0.5 MG tablet, TAKE 1 TABLET BY MOUTH DAILY *NEED APPT FOR REFILLS*, Disp: 30 tablet, Rfl: 1 .  butalbital-acetaminophen-caffeine (FIORICET, ESGIC) 50-325-40 MG tablet, TAKE ONE TO TWO TABLETS EVERY FOUR HOURSAS NEEDED FOR HEADACHE, Disp: 35 tablet, Rfl: 3 .  cetirizine (ZYRTEC) 10 MG tablet, Take 1 tablet (10 mg total) by mouth daily., Disp: 30 tablet, Rfl: 11 .  dexlansoprazole (DEXILANT) 60 MG capsule, Take by mouth., Disp: , Rfl:  .  diclofenac (VOLTAREN) 75 MG EC tablet, Take 75 mg by mouth 2 (two) times daily., Disp: , Rfl:  .  fluticasone (FLONASE) 50 MCG/ACT nasal spray, USE 2 SPRAYS EACH NOSTRIL DAILY, Disp: 16 g, Rfl: 11 .  furosemide (LASIX) 20 MG tablet, Take by mouth., Disp: , Rfl:  .  gabapentin (NEURONTIN) 600 MG tablet, Take 1 tablet (600 mg total) by mouth at bedtime., Disp: 90 tablet, Rfl: 1 .  meloxicam (MOBIC) 15 MG tablet, Take 1 tablet (15 mg total) by mouth daily., Disp: 30 tablet, Rfl: 0 .  metoCLOPramide (REGLAN) 5 MG tablet, , Disp: , Rfl:  .  Multiple Vitamin (MULTIVITAMIN) tablet, Take 1 tablet by mouth daily.,  Disp: , Rfl:  .  naproxen (NAPROSYN) 500 MG tablet, Take 1 tablet (500 mg total) by mouth 2 (two) times daily with a meal., Disp: 60 tablet, Rfl: 1 .  omeprazole (PRILOSEC) 20 MG capsule, Take 1 capsule (20 mg total) by mouth 2 (two) times daily before a meal., Disp: 60 capsule, Rfl: 4 .  pantoprazole (PROTONIX) 40 MG tablet, Take 1 tablet (40 mg total) by mouth daily., Disp: 30 tablet, Rfl: 5 .  polyethylene glycol powder (GLYCOLAX/MIRALAX) powder, Take 17 g by mouth daily., Disp: 3350 g, Rfl: 5 .  RABEprazole (ACIPHEX) 20 MG tablet, Take 20 mg by mouth 2 (two) times daily., Disp: , Rfl:  .  rizatriptan (MAXALT) 10 MG tablet, Take 1 tab at onset of headache, May repeat in 2 hours if needed; max up to 30 mg daily, Disp: 10 tablet, Rfl: 0 .  sertraline (ZOLOFT) 100 MG tablet, TAKE 1 TABLET BY MOUTH DAILY, Disp: 90 tablet, Rfl: 2 .  sucralfate (CARAFATE) 1 g tablet, TAKE ONE (1) TABLET FOUR (4) TIMES PER DAY -WITH MEALS AND AT BEDTIME, Disp: 240 tablet, Rfl: 4 .  temazepam (RESTORIL) 30 MG capsule, Take 30 mg by mouth at bedtime as needed., Disp: , Rfl:  .  Verapamil HCl CR 200 MG CP24, Take 1 capsule (200 mg total) by mouth daily., Disp: 30 capsule, Rfl: 5  Review  of Systems  Constitutional: Positive for fatigue. Negative for chills and fever.  HENT: Positive for ear discharge. Negative for congestion, ear pain, facial swelling, hearing loss and sore throat.   Respiratory: Negative for cough and shortness of breath.   Musculoskeletal: Negative for arthralgias, joint swelling, myalgias, neck pain and neck stiffness.  Allergic/Immunologic: Positive for environmental allergies.  Neurological: Positive for headaches. Negative for dizziness.  Psychiatric/Behavioral: The patient is nervous/anxious.     Social History   Tobacco Use  . Smoking status: Never Smoker  . Smokeless tobacco: Never Used  Substance Use Topics  . Alcohol use: No    Alcohol/week: 0.0 standard drinks      Objective:    BP 129/82   Pulse 90   Temp 97.8 F (36.6 C)   Resp 16   Wt 180 lb (81.6 kg) Comment: per patient  SpO2 98%   BMI 30.90 kg/m  Vitals:   10/31/19 1445  BP: 129/82  Pulse: 90  Resp: 16  Temp: 97.8 F (36.6 C)  SpO2: 98%  Weight: 180 lb (81.6 kg)  Body mass index is 30.9 kg/m.   Physical Exam Vitals signs reviewed.  Constitutional:      Appearance: Normal appearance.  HENT:     Head: Normocephalic and atraumatic.     Right Ear: Tympanic membrane, ear canal and external ear normal.     Left Ear: Tympanic membrane, ear canal and external ear normal.     Ears:     Comments: Small amount of dried blood in Rt EAC .Rt TM ereythematous    Nose: Nose normal.  Eyes:     General: No scleral icterus. Cardiovascular:     Rate and Rhythm: Normal rate and regular rhythm.     Heart sounds: Normal heart sounds.  Pulmonary:     Breath sounds: Normal breath sounds.  Abdominal:     Palpations: Abdomen is soft.  Skin:    General: Skin is warm and dry.  Neurological:     General: No focal deficit present.     Mental Status: She is alert and oriented to person, place, and time.  Psychiatric:        Mood and Affect: Mood normal.        Behavior: Behavior normal.        Thought Content: Thought content normal.        Judgment: Judgment normal.      No results found for any visits on 10/31/19.     Assessment & Plan    1. Acute otitis media, unspecified otitis media type Might need ENT referral. - azithromycin (ZITHROMAX) 250 MG tablet; Take 2 tablets by mouth today, then 1 tablet by mouth daily.  Dispense: 6 tablet; Refill: 0  2. Mood disorder (HCC) Stable but chronic anxiety. GAD7 and PHQ9 on next visit.     Mell Mellott Wendelyn Breslow, MD  [facility]

## 2019-11-24 ENCOUNTER — Other Ambulatory Visit: Payer: Self-pay | Admitting: Family Medicine

## 2019-11-24 DIAGNOSIS — F419 Anxiety disorder, unspecified: Secondary | ICD-10-CM

## 2019-11-24 NOTE — Telephone Encounter (Signed)
Requested medication (s) are due for refill today: yes  Requested medication (s) are on the active medication list: yes  Last refill:  10/13/2019  Future visit scheduled: no  Notes to clinic:  Refill cannot be delegated    Requested Prescriptions  Pending Prescriptions Disp Refills   ALPRAZolam (XANAX) 0.5 MG tablet [Pharmacy Med Name: ALPRAZOLAM 0.5 MG TAB] 30 tablet     Sig: TAKE 1 TABLET BY MOUTH DAILY *NEED APPT FOR REFILLS*     Not Delegated - Psychiatry:  Anxiolytics/Hypnotics Failed - 11/24/2019 12:19 PM      Failed - This refill cannot be delegated      Failed - Urine Drug Screen completed in last 360 days.      Failed - Valid encounter within last 6 months    Recent Outpatient Visits          3 weeks ago Acute otitis media, unspecified otitis media type   [facility] Jerrol Banana., MD   6 months ago Plantar fasciitis of right foot   [facility] Jerrol Banana., MD   11 months ago URI, acute   [facility] [place], [place], [place]   11 months ago Plantar fasciitis of right foot   [place], [place]   1 year ago Maxillary sinusitis, unspecified chronicity   [facility] Jerrol Banana., MD

## 2020-01-05 ENCOUNTER — Other Ambulatory Visit: Payer: Self-pay | Admitting: Family Medicine

## 2020-01-05 DIAGNOSIS — K219 Gastro-esophageal reflux disease without esophagitis: Secondary | ICD-10-CM

## 2020-01-18 ENCOUNTER — Other Ambulatory Visit: Payer: Self-pay | Admitting: Family Medicine

## 2020-01-18 DIAGNOSIS — F419 Anxiety disorder, unspecified: Secondary | ICD-10-CM

## 2020-01-21 ENCOUNTER — Other Ambulatory Visit: Payer: Self-pay | Admitting: Family Medicine

## 2020-01-21 DIAGNOSIS — F419 Anxiety disorder, unspecified: Secondary | ICD-10-CM

## 2020-02-25 ENCOUNTER — Other Ambulatory Visit: Payer: Self-pay

## 2020-02-25 ENCOUNTER — Encounter: Payer: Self-pay | Admitting: Family Medicine

## 2020-02-25 ENCOUNTER — Ambulatory Visit: Payer: 59 | Admitting: Family Medicine

## 2020-02-25 VITALS — BP 124/84 | HR 87 | Temp 96.9°F | Resp 16 | Ht 64.0 in

## 2020-02-25 DIAGNOSIS — G43119 Migraine with aura, intractable, without status migrainosus: Secondary | ICD-10-CM

## 2020-02-25 DIAGNOSIS — F39 Unspecified mood [affective] disorder: Secondary | ICD-10-CM

## 2020-02-25 DIAGNOSIS — E785 Hyperlipidemia, unspecified: Secondary | ICD-10-CM | POA: Diagnosis not present

## 2020-02-25 DIAGNOSIS — F4323 Adjustment disorder with mixed anxiety and depressed mood: Secondary | ICD-10-CM | POA: Diagnosis not present

## 2020-02-25 MED ORDER — SERTRALINE HCL 100 MG PO TABS: ORAL_TABLET | ORAL | 0 refills | Status: DC

## 2020-02-25 NOTE — Progress Notes (Signed)
Patient: Elizabeth Munoz Female    DOB: 08/05/1966   54 y.o.   MRN: [id number] Visit Date: 02/25/2020  Today's Provider: Megan Mans, MD   Chief Complaint  Patient presents with  . Depression  . Anxiety   Subjective:     HPI   Mood disorder (HCC) Stable but chronic anxiety. GAD7 and PHQ9 on next visit.   Allergies  Allergen Reactions  . Imitrex [Sumatriptan]     Stomach pain, nausea, vomiting, felt bad     Current Outpatient Medications:  .  ALPRAZolam (XANAX) 0.5 MG tablet, TAKE ONE TABLET EVERY DAY NEEDS APPT FORREFILLS, Disp: 30 tablet, Rfl: 3 .  butalbital-acetaminophen-caffeine (FIORICET, ESGIC) 50-325-40 MG tablet, TAKE ONE TO TWO TABLETS EVERY FOUR HOURSAS NEEDED FOR HEADACHE, Disp: 35 tablet, Rfl: 3 .  cetirizine (ZYRTEC) 10 MG tablet, Take 1 tablet (10 mg total) by mouth daily., Disp: 30 tablet, Rfl: 11 .  dexlansoprazole (DEXILANT) 60 MG capsule, Take by mouth., Disp: , Rfl:  .  fluticasone (FLONASE) 50 MCG/ACT nasal spray, USE 2 SPRAYS EACH NOSTRIL DAILY, Disp: 16 g, Rfl: 11 .  furosemide (LASIX) 20 MG tablet, Take by mouth., Disp: , Rfl:  .  gabapentin (NEURONTIN) 600 MG tablet, Take 1 tablet (600 mg total) by mouth at bedtime., Disp: 90 tablet, Rfl: 1 .  Multiple Vitamin (MULTIVITAMIN) tablet, Take 1 tablet by mouth daily., Disp: , Rfl:  .  omeprazole (PRILOSEC) 20 MG capsule, Take 1 capsule (20 mg total) by mouth 2 (two) times daily before a meal., Disp: 60 capsule, Rfl: 4 .  pantoprazole (PROTONIX) 40 MG tablet, TAKE 1 TABLET BY MOUTH TWICE DAILY, Disp: 60 tablet, Rfl: 11 .  rizatriptan (MAXALT) 10 MG tablet, Take 1 tab at onset of headache, May repeat in 2 hours if needed; max up to 30 mg daily, Disp: 10 tablet, Rfl: 0 .  sertraline (ZOLOFT) 100 MG tablet, TAKE 1 TABLET BY MOUTH DAILY, Disp: 90 tablet, Rfl: 2 .  sucralfate (CARAFATE) 1 g tablet, TAKE ONE (1) TABLET FOUR (4) TIMES PER DAY -WITH MEALS AND AT BEDTIME, Disp: 240 tablet, Rfl: 4 .   temazepam (RESTORIL) 30 MG capsule, Take 30 mg by mouth at bedtime as needed., Disp: , Rfl:  .  Verapamil HCl CR 200 MG CP24, Take 1 capsule (200 mg total) by mouth daily., Disp: 30 capsule, Rfl: 5 .  azithromycin (ZITHROMAX) 250 MG tablet, Take 2 tablets by mouth today, then 1 tablet by mouth daily. (Patient not taking: Reported on 02/25/2020), Disp: 6 tablet, Rfl: 0 .  diclofenac (VOLTAREN) 75 MG EC tablet, Take 75 mg by mouth 2 (two) times daily., Disp: , Rfl:  .  meloxicam (MOBIC) 15 MG tablet, Take 1 tablet (15 mg total) by mouth daily., Disp: 30 tablet, Rfl: 0 .  metoCLOPramide (REGLAN) 5 MG tablet, , Disp: , Rfl:  .  naproxen (NAPROSYN) 500 MG tablet, Take 1 tablet (500 mg total) by mouth 2 (two) times daily with a meal., Disp: 60 tablet, Rfl: 1 .  polyethylene glycol powder (GLYCOLAX/MIRALAX) powder, Take 17 g by mouth daily., Disp: 3350 g, Rfl: 5 .  RABEprazole (ACIPHEX) 20 MG tablet, Take 20 mg by mouth 2 (two) times daily., Disp: , Rfl:   Review of Systems  Constitutional: Negative for appetite change, chills, fatigue and fever.  Eyes: Negative.   Respiratory: Negative for chest tightness and shortness of breath.   Cardiovascular: Negative for chest pain and palpitations.  Gastrointestinal:  Negative for abdominal pain, nausea and vomiting.  Endocrine: Negative.   Allergic/Immunologic: Negative.   Neurological: Negative for dizziness and weakness.  Hematological: Negative.   Psychiatric/Behavioral: Negative.     Social History   Tobacco Use  . Smoking status: Never Smoker  . Smokeless tobacco: Never Used  Substance Use Topics  . Alcohol use: No    Alcohol/week: 0.0 standard drinks      Objective:   BP 124/84 (BP Location: Left Arm, Patient Position: Sitting, Cuff Size: Large)   Pulse 87   Temp (!) 96.9 F (36.1 C) (Other (Comment))   Resp 16   Ht 5\' 4"  (1.626 m)   SpO2 97%   BMI 30.90 kg/m  Vitals:   02/25/20 1609  BP: 124/84  Pulse: 87  Resp: 16  Temp: (!)  96.9 F (36.1 C)  TempSrc: Other (Comment)  SpO2: 97%  Height: 5\' 4"  (1.626 m)  Body mass index is 30.9 kg/m.   Physical Exam Vitals reviewed.  Constitutional:      Appearance: Normal appearance.  HENT:     Head: Normocephalic and atraumatic.     Right Ear: External ear normal.     Left Ear: External ear normal.     Nose: Nose normal.  Eyes:     General: No scleral icterus. Cardiovascular:     Rate and Rhythm: Normal rate and regular rhythm.     Heart sounds: Normal heart sounds.  Pulmonary:     Breath sounds: Normal breath sounds.  Abdominal:     Palpations: Abdomen is soft.  Skin:    General: Skin is warm and dry.  Neurological:     General: No focal deficit present.     Mental Status: She is alert and oriented to person, place, and time.  Psychiatric:        Mood and Affect: Mood normal.        Behavior: Behavior normal.        Thought Content: Thought content normal.        Judgment: Judgment normal.      No results found for any visits on 02/25/20.     Assessment & Plan    1. Adjustment disorder with mixed anxiety and depressed mood She is not suicidal or homicidal.  She is very unhappy with her job presently.  She scores a 23 on her PHQ-9 and a 17 no or GAD 7. Increase sertraline by 50%.  Refer her for counseling to .  Return to clinic 1 month.  Would definitely try to stay away from benzodiazepines in this patient for her anxiety.More than 50% 30 minute visit spent in counseling or coordination of care  - sertraline (ZOLOFT) 100 MG tablet; Take 1 and 1/2 tablets daily  Dispense: 135 tablet; Refill: 0 - CBC w/Diff/Platelet - Comprehensive Metabolic Panel (CMET) - TSH - Lipid panel  2. Hyperlipidemia, unspecified hyperlipidemia type  - sertraline (ZOLOFT) 100 MG tablet; Take 1 and 1/2 tablets daily  Dispense: 135 tablet; Refill: 0 - CBC w/Diff/Platelet - Comprehensive Metabolic Panel (CMET) - TSH - Lipid panel 3.  Migraine  headache Patient states she has had increased frequency but more so increase in treatment resistant migraines recently.  At this time she is given samples of Nurtec to take for recalcitrant headaches.  She has 4 samples.  If this works we will try to write her prescription.  May need referral to neurology/headache specialist if this is persistent or not able to come under control.  I am not suspicious of anything  beyond her migraines.   Follow up in 1 month.     I,[name],acting as a scribe for Wilhemena Durie, MD.,have documented all relevant documentation on the behalf of Wilhemena Durie, MD,as directed by  Wilhemena Durie, MD while in the presence of Wilhemena Durie, MD.      Wilhemena Durie, MD  [facility]

## 2020-02-25 NOTE — Patient Instructions (Signed)
For bad migraine headaches, try Nurtec 75 mg 1 tablet as needed. Follow up in one month.

## 2020-03-22 ENCOUNTER — Other Ambulatory Visit: Payer: Self-pay | Admitting: Family Medicine

## 2020-03-22 NOTE — Telephone Encounter (Signed)
Requested Prescriptions  Pending Prescriptions Disp Refills  . gabapentin (NEURONTIN) 600 MG tablet [Pharmacy Med Name: GABAPENTIN 600 MG TAB] 90 tablet 1    Sig: TAKE 1 TABLET AT BEDTIME     Neurology: Anticonvulsants - gabapentin Passed - 03/22/2020 10:54 AM      Passed - Valid encounter within last 12 months    Recent Outpatient Visits          3 weeks ago Hyperlipidemia, unspecified hyperlipidemia type   [facility] Maple Hudson., MD   4 months ago Acute otitis media, unspecified otitis media type   [facility] Maple Hudson., MD   10 months ago Plantar fasciitis of right foot   [facility] Maple Hudson., MD   1 year ago URI, acute   [facility] [place], [place], [place]   1 year ago Plantar fasciitis of right foot   [facility] [place], [place], [place]      Future Appointments            In 2 weeks Maple Hudson., MD [facility]

## 2020-03-27 ENCOUNTER — Other Ambulatory Visit: Payer: Self-pay | Admitting: Family Medicine

## 2020-03-27 DIAGNOSIS — R1010 Upper abdominal pain, unspecified: Secondary | ICD-10-CM

## 2020-03-27 NOTE — Telephone Encounter (Signed)
Requested medication (s) are due for refill today: Yes  Requested medication (s) are on the active medication list: Yes  Last refill:  03/12/19  Future visit scheduled: Yes  Notes to clinic:  Prescription has expired.    Requested Prescriptions  Pending Prescriptions Disp Refills   sucralfate (CARAFATE) 1 g tablet [Pharmacy Med Name: SUCRALFATE 1 GM TAB] 240 tablet 4    Sig: TAKE 1 TABLET BY MOUTH 4 TIMES DAILY(WITH MEALS AND AT BEDTIME)      Gastroenterology: Antiacids Passed - 03/27/2020  9:55 AM      Passed - Valid encounter within last 12 months    Recent Outpatient Visits           1 month ago Hyperlipidemia, unspecified hyperlipidemia type   [facility] Maple Hudson., MD   4 months ago Acute otitis media, unspecified otitis media type   [facility] Maple Hudson., MD   10 months ago Plantar fasciitis of right foot   [facility] Maple Hudson., MD   1 year ago URI, acute   [facility] [place], [place], [place]   1 year ago Plantar fasciitis of right foot   [facility] [place], [place], [place]       Future Appointments             In 2 weeks Maple Hudson., MD [facility]

## 2020-04-07 NOTE — Progress Notes (Deleted)
Established patient visit    Patient: Elizabeth Munoz   DOB: 03/20/1966   54 y.o. Female  MRN: [id number] Visit Date: 04/07/2020  Today's healthcare provider: Wilhemena Durie, MD   No chief complaint on file.  Subjective    HPI Depression, Follow-up  She  was last seen for this {NUMBERS 1-12:18279} {days/wks/mos/yrs:310907} ago. Changes made at last visit include ***.   She reports {excellent/good/fair/poor:19665} compliance with treatment. She {is/is not:21021397} having side effects. ***  She reports {DESC; GOOD/FAIR/POOR:18685} tolerance of treatment. Current symptoms include: {Symptoms; depression:1002} She feels she is {improved/worse/unchanged:3041574} since last visit.  Depression screen [facility] 2/9 02/25/2020 12/29/2018 12/22/2018  Decreased Interest 3 0 1  Down, Depressed, Hopeless 3 0 1  PHQ - 2 Score 6 0 2  Altered sleeping 3 - -  Tired, decreased energy 3 - -  Change in appetite 2 - -  Feeling bad or failure about yourself  3 - -  Trouble concentrating 3 - -  Moving slowly or fidgety/restless 3 - -  Suicidal thoughts 0 - -  PHQ-9 Score 23 - -  Difficult doing work/chores Extremely dIfficult - -    -----------------------------------------------------------------------------------------  {Show patient history (optional):23778::" "}   Medications: Outpatient Medications Prior to Visit  Medication Sig  . ALPRAZolam (XANAX) 0.5 MG tablet TAKE ONE TABLET EVERY DAY NEEDS APPT FORREFILLS  . azithromycin (ZITHROMAX) 250 MG tablet Take 2 tablets by mouth today, then 1 tablet by mouth daily. (Patient not taking: Reported on 02/25/2020)  . butalbital-acetaminophen-caffeine (FIORICET, ESGIC) 50-325-40 MG tablet TAKE ONE TO TWO TABLETS EVERY FOUR HOURSAS NEEDED FOR HEADACHE  . cetirizine (ZYRTEC) 10 MG tablet Take 1 tablet (10 mg total) by mouth daily.  Marland Kitchen dexlansoprazole (DEXILANT) 60 MG capsule Take by mouth.  . diclofenac (VOLTAREN) 75 MG EC tablet Take 75 mg by  mouth 2 (two) times daily.  . fluticasone (FLONASE) 50 MCG/ACT nasal spray USE 2 SPRAYS EACH NOSTRIL DAILY  . furosemide (LASIX) 20 MG tablet Take by mouth.  . gabapentin (NEURONTIN) 600 MG tablet TAKE 1 TABLET AT BEDTIME  . meloxicam (MOBIC) 15 MG tablet Take 1 tablet (15 mg total) by mouth daily.  . metoCLOPramide (REGLAN) 5 MG tablet   . Multiple Vitamin (MULTIVITAMIN) tablet Take 1 tablet by mouth daily.  . naproxen (NAPROSYN) 500 MG tablet Take 1 tablet (500 mg total) by mouth 2 (two) times daily with a meal.  . omeprazole (PRILOSEC) 20 MG capsule Take 1 capsule (20 mg total) by mouth 2 (two) times daily before a meal.  . pantoprazole (PROTONIX) 40 MG tablet TAKE 1 TABLET BY MOUTH TWICE DAILY  . polyethylene glycol powder (GLYCOLAX/MIRALAX) powder Take 17 g by mouth daily.  . RABEprazole (ACIPHEX) 20 MG tablet Take 20 mg by mouth 2 (two) times daily.  . rizatriptan (MAXALT) 10 MG tablet Take 1 tab at onset of headache, May repeat in 2 hours if needed; max up to 30 mg daily  . sertraline (ZOLOFT) 100 MG tablet Take 1 and 1/2 tablets daily  . sucralfate (CARAFATE) 1 g tablet TAKE 1 TABLET BY MOUTH 4 TIMES DAILY(WITH MEALS AND AT BEDTIME)  . temazepam (RESTORIL) 30 MG capsule Take 30 mg by mouth at bedtime as needed.  . Verapamil HCl CR 200 MG CP24 Take 1 capsule (200 mg total) by mouth daily.   No facility-administered medications prior to visit.    Review of Systems  {Show previous labs (optional):23779::" "}   Objective    There  were no vitals taken for this visit. {Show previous vital signs (optional):23777::" "}  Physical Exam  ***  No results found for any visits on 04/10/20.   Assessment & Plan    ***  No follow-ups on file.      {provider attestation***:1}   Megan Mans, MD  [facility] [phone] (phone) [phone] (fax)  [facility]

## 2020-04-10 ENCOUNTER — Ambulatory Visit: Payer: Self-pay | Admitting: Family Medicine

## 2020-05-05 ENCOUNTER — Telehealth: Payer: Self-pay | Admitting: Family Medicine

## 2020-05-05 DIAGNOSIS — G43119 Migraine with aura, intractable, without status migrainosus: Secondary | ICD-10-CM

## 2020-05-05 NOTE — Telephone Encounter (Signed)
Request for new Rx- sampled Nurtec and would like Rx.

## 2020-05-05 NOTE — Telephone Encounter (Signed)
Copied from CRM [id number]. Topic: Quick Communication - Rx Refill/Question >> May 05, 2020  9:48 AM Dalphine Handing A wrote: Medication: Nurtec (Patient was given sample of medication and was advised to call back if she was interested in prescription being sent in.)  Has the patient contacted their pharmacy? yes (Agent: If no, request that the patient contact the pharmacy for the refill.) (Agent: If yes, when and what did the pharmacy advise?)contact pcp  Preferred Pharmacy (with phone number or street name): [facility] - [place], [place] - [address]  Phone:  [phone] Fax:  [phone]     Agent: Please be advised that RX refills may take up to 3 business days. We ask that you follow-up with your pharmacy.

## 2020-05-09 NOTE — Telephone Encounter (Signed)
PT is checking on the status on the medication listed below / Pt will like a call back today / please advise

## 2020-05-09 NOTE — Telephone Encounter (Signed)
OK to refill--Samples in my office also beside fridge. ! Po q day as needed for migraine--#8 tablets covered--5RF Also give her coupon with samples.

## 2020-05-12 MED ORDER — NURTEC 75 MG PO TBDP: 75.0000 mg | ORAL_TABLET | Freq: Every day | ORAL | 5 refills | Status: DC

## 2020-05-12 NOTE — Telephone Encounter (Signed)
Called to advise patient that the medication has been sent over to the pharmacy for her and that we will leave some samples at the front desk for her also.

## 2020-05-22 NOTE — Telephone Encounter (Signed)
Patient advised that samples were left at front desk a few days ago and that she needs the PA to be sent here to the office.

## 2020-05-22 NOTE — Telephone Encounter (Signed)
Insurance is requiring a  PA for Rimegepant Sulfate (NURTEC) 75 MG TBDP. Patient would to know if PCP has samples. Best return call back # [phone]

## 2020-05-23 ENCOUNTER — Telehealth: Payer: Self-pay

## 2020-05-23 NOTE — Telephone Encounter (Signed)
Copied from CRM [id number]. Topic: General - Other >> May 23, 2020 11:27 AM Dalphine Handing A wrote: Patients spouse Jillyn Hidden is requesting a callback from Dr. Wonda Olds nurse on a status update for PA for medication.

## 2020-05-26 NOTE — Telephone Encounter (Signed)
In progress

## 2020-05-29 NOTE — Telephone Encounter (Signed)
PA was denied

## 2020-05-30 NOTE — Telephone Encounter (Signed)
Advised patient that PA was denied for medication, she would like to know what else they can do in regards to medication. Please advise?

## 2020-06-05 ENCOUNTER — Telehealth: Payer: Self-pay

## 2020-06-05 NOTE — Telephone Encounter (Signed)
Copied from CRM [id number]. Topic: General - Other >> Jun 05, 2020 10:44 AM Lyn Hollingshead D wrote: Reason for CRM: Pt has some issues with the new migraine medication and the insurance / need to speak with someone about this  / please advise

## 2020-06-06 NOTE — Telephone Encounter (Signed)
Return call to patient's husband Elizabeth Munoz. Elizabeth Munoz wanted to know since pt's insurance denied PA for Nurtec, is there another alternative she can try? And if so, do we have any samples pt can try first? Please advise?

## 2020-06-16 NOTE — Telephone Encounter (Signed)
Cover [employer] Med[employer] had called question the PA and denial on this, wants a FU at [phone]   With KEY # [id number]

## 2020-06-18 NOTE — Telephone Encounter (Signed)
Appeal has been fax.

## 2020-06-21 ENCOUNTER — Other Ambulatory Visit: Payer: Self-pay | Admitting: Family Medicine

## 2020-06-21 MED ORDER — POLYETHYLENE GLYCOL 3350 17 GM/SCOOP PO POWD: ORAL | 6 refills | Status: DC

## 2020-06-21 NOTE — Telephone Encounter (Signed)
Requested Prescriptions  Pending Prescriptions Disp Refills  . QC NATURA-LAX 17 GM/SCOOP powder [Pharmacy Med Name: QC NATURA-LAX 17 GM/SCOOP POW GM] 510 g 6    Sig: TAKE 17 GRAMS EVERY DAY     Gastroenterology:  Laxatives Passed - 06/21/2020  9:09 AM      Passed - Valid encounter within last 12 months    Recent Outpatient Visits          3 months ago Hyperlipidemia, unspecified hyperlipidemia type   [facility] Maple Hudson., MD   7 months ago Acute otitis media, unspecified otitis media type   [facility] Maple Hudson., MD   1 year ago Plantar fasciitis of right foot   [facility] Maple Hudson., MD   1 year ago URI, acute   [facility] [place], [place], [place]   1 year ago Plantar fasciitis of right foot   [facility] [place], [place], [place]

## 2020-06-21 NOTE — Addendum Note (Signed)
Addended by: Garrison Columbus on: 06/21/2020 09:31 AM   Modules accepted: Orders

## 2020-06-26 ENCOUNTER — Other Ambulatory Visit: Payer: Self-pay | Admitting: Family Medicine

## 2020-06-26 DIAGNOSIS — F419 Anxiety disorder, unspecified: Secondary | ICD-10-CM

## 2020-06-26 NOTE — Telephone Encounter (Signed)
Requested medication (s) are due for refill today - no  Requested medication (s) are on the active medication list -yes  Future visit scheduled -no  Last refill: 06/23/20  Notes to clinic: Request for non delegated Rx  Requested Prescriptions  Pending Prescriptions Disp Refills   ALPRAZolam (XANAX) 0.5 MG tablet [Pharmacy Med Name: ALPRAZOLAM 0.5 MG TAB] 30 tablet     Sig: TAKE ONE TABLET EVERY DAY NEEDS APPT FORREFILLS      Not Delegated - Psychiatry:  Anxiolytics/Hypnotics Failed - 06/26/2020  9:40 AM      Failed - This refill cannot be delegated      Failed - Urine Drug Screen completed in last 360 days.      Passed - Valid encounter within last 6 months    Recent Outpatient Visits           4 months ago Hyperlipidemia, unspecified hyperlipidemia type   [facility] Maple Hudson., MD   7 months ago Acute otitis media, unspecified otitis media type   [facility] Maple Hudson., MD   1 year ago Plantar fasciitis of right foot   [facility] Maple Hudson., MD   1 year ago URI, acute   [facility] [place], [place], [place]   1 year ago Plantar fasciitis of right foot   [facility] [place], [place], [place]                  Requested Prescriptions  Pending Prescriptions Disp Refills   ALPRAZolam (XANAX) 0.5 MG tablet [Pharmacy Med Name: ALPRAZOLAM 0.5 MG TAB] 30 tablet     Sig: TAKE ONE TABLET EVERY DAY NEEDS APPT FORREFILLS      Not Delegated - Psychiatry:  Anxiolytics/Hypnotics Failed - 06/26/2020  9:40 AM      Failed - This refill cannot be delegated      Failed - Urine Drug Screen completed in last 360 days.      Passed - Valid encounter within last 6 months    Recent Outpatient Visits           4 months ago Hyperlipidemia, unspecified hyperlipidemia type   [facility] Maple Hudson., MD   7 months ago Acute otitis media, unspecified otitis media type    [facility] Maple Hudson., MD   1 year ago Plantar fasciitis of right foot   [facility] Maple Hudson., MD   1 year ago URI, acute   [facility] [place], [place], [place]   1 year ago Plantar fasciitis of right foot   [facility] [place], [place], [place]

## 2020-07-10 ENCOUNTER — Other Ambulatory Visit: Payer: Self-pay | Admitting: Family Medicine

## 2020-08-05 ENCOUNTER — Other Ambulatory Visit: Payer: Self-pay | Admitting: Family Medicine

## 2020-08-05 DIAGNOSIS — A048 Other specified bacterial intestinal infections: Secondary | ICD-10-CM

## 2020-08-05 DIAGNOSIS — F419 Anxiety disorder, unspecified: Secondary | ICD-10-CM

## 2020-08-05 DIAGNOSIS — M722 Plantar fascial fibromatosis: Secondary | ICD-10-CM

## 2020-08-06 MED ORDER — NAPROXEN 375 MG PO TABS: 500.0000 mg | ORAL_TABLET | Freq: Two times a day (BID) | ORAL | 2 refills | Status: DC | PRN

## 2020-08-06 MED ORDER — TEMAZEPAM 30 MG PO CAPS: 30.0000 mg | ORAL_CAPSULE | Freq: Every evening | ORAL | 2 refills | Status: DC | PRN

## 2020-09-15 ENCOUNTER — Telehealth: Payer: Self-pay

## 2020-09-15 NOTE — Telephone Encounter (Signed)
Please advise 

## 2020-09-15 NOTE — Telephone Encounter (Signed)
Copied from CRM [id number]. Topic: General - Other >> Sep 15, 2020 10:22 AM Jaquita Rector A wrote: Reason for CRM: Patient called to schedule an appointment with Dr Sullivan Lone She would like to know from Dr Sullivan Lone if she can come in today to the lab to have blood work done that was ordered on 02/25/20 since she was never able to come in and have that completed. Patient can be reached with an answer at Ph# [phone]

## 2020-09-15 NOTE — Telephone Encounter (Signed)
Copied from CRM [id number]. Topic: General - Other >> Sep 12, 2020  9:15 AM Elliot Gault wrote: Patient experiencing ? possible sinus infection and awaiting COVID results. Patient requesting antibiotics stating her insurance will not office visit therefore declined appointment. Patient would like a follow up call

## 2020-09-16 ENCOUNTER — Encounter: Payer: Self-pay | Admitting: Family Medicine

## 2020-09-16 ENCOUNTER — Ambulatory Visit (INDEPENDENT_AMBULATORY_CARE_PROVIDER_SITE_OTHER): Payer: 59 | Admitting: Family Medicine

## 2020-09-16 ENCOUNTER — Other Ambulatory Visit: Payer: Self-pay

## 2020-09-16 VITALS — BP 128/84 | HR 89 | Temp 98.2°F | Wt 174.6 lb

## 2020-09-16 DIAGNOSIS — F4323 Adjustment disorder with mixed anxiety and depressed mood: Secondary | ICD-10-CM

## 2020-09-16 DIAGNOSIS — F39 Unspecified mood [affective] disorder: Secondary | ICD-10-CM | POA: Diagnosis not present

## 2020-09-16 DIAGNOSIS — E663 Overweight: Secondary | ICD-10-CM

## 2020-09-16 DIAGNOSIS — J329 Chronic sinusitis, unspecified: Secondary | ICD-10-CM | POA: Diagnosis not present

## 2020-09-16 DIAGNOSIS — G43119 Migraine with aura, intractable, without status migrainosus: Secondary | ICD-10-CM

## 2020-09-16 MED ORDER — AZITHROMYCIN 250 MG PO TABS: ORAL_TABLET | ORAL | 0 refills | Status: DC

## 2020-09-16 NOTE — Patient Instructions (Signed)
TAKE ROBITUSSIN TWICE A DAY!!!

## 2020-09-16 NOTE — Telephone Encounter (Signed)
Patient has ov appt today.

## 2020-09-16 NOTE — Telephone Encounter (Signed)
Told Christian with [facility] that patient has an active lab requisition from back in March that she can go ahead and have labs done.Not sure if this are the only labs she is needing. I advised Atiya CMA about the situation and she is going to get in contact with patient.

## 2020-09-16 NOTE — Progress Notes (Signed)
Established patient visit   Patient: Elizabeth Munoz   DOB: 1966/03/22   54 y.o. Female  MRN: [id number] Visit Date: 09/16/2020  Today's healthcare provider: Megan Mans, MD   Chief Complaint  Patient presents with  . Sinus Problem   Subjective    Sinus Problem This is a new problem. The current episode started 1 to 4 weeks ago. The problem has been gradually improving since onset. There has been no fever. Associated symptoms include coughing, headaches, a hoarse voice and sinus pressure. Pertinent negatives include no chills, congestion, diaphoresis, ear pain, neck pain, shortness of breath, sneezing, sore throat or swollen glands. Treatments tried: mucinex. The treatment provided mild relief.    Patient had negative covid test that came back on Friday. 4 days ago.  Patient would also like to discuss weight.  She has been on [place] since May and has lost 20 pounds but thinks she should have lost more.  The patient did not follow-up on anxiety this summer because she had the summer off.  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  She is not suicidal or homicidal.  She said she is very anxious   Office Visit from 09/16/2020 in [facility]  PHQ-9 Total Score 20     GAD 7 : Generalized Anxiety Score 09/16/2020 02/25/2020  Nervous, Anxious, on Edge 3 3  Control/stop worrying 3 3  Worry too much - different things 3 3  Trouble relaxing 2 3  Restless 2 0  Easily annoyed or irritable 3 2  Afraid - awful might happen 3 2  Total GAD 7 Score 19 16  Anxiety Difficulty - Extremely difficult    Past Surgical History:  Procedure Laterality Date  . BREAST REDUCTION SURGERY    . CESAREAN SECTION     x 2  . TUBAL LIGATION    . UPPER GI ENDOSCOPY     chronic gastritis, LA esophagitis   Social History   Tobacco Use  . Smoking status: Never Smoker  . Smokeless tobacco: Never Used  Substance Use Topics  . Alcohol use: No    Alcohol/week: 0.0  standard drinks  . Drug use: No       Medications: Outpatient Medications Prior to Visit  Medication Sig  . ALPRAZolam (XANAX) 0.5 MG tablet TAKE ONE TABLET EVERY DAY NEEDS APPT FORREFILLS  . butalbital-acetaminophen-caffeine (FIORICET, ESGIC) 50-325-40 MG tablet TAKE ONE TO TWO TABLETS EVERY FOUR HOURSAS NEEDED FOR HEADACHE  . cetirizine (ZYRTEC) 10 MG tablet Take 1 tablet (10 mg total) by mouth daily.  Marland Kitchen dexlansoprazole (DEXILANT) 60 MG capsule Take by mouth.  . diclofenac (VOLTAREN) 75 MG EC tablet Take 75 mg by mouth 2 (two) times daily.  . fluticasone (FLONASE) 50 MCG/ACT nasal spray USE 2 SPRAYS EACH NOSTRIL DAILY  . furosemide (LASIX) 20 MG tablet Take by mouth.  . gabapentin (NEURONTIN) 600 MG tablet TAKE 1 TABLET AT BEDTIME  . meloxicam (MOBIC) 15 MG tablet Take 1 tablet (15 mg total) by mouth daily.  . metoCLOPramide (REGLAN) 5 MG tablet   . Multiple Vitamin (MULTIVITAMIN) tablet Take 1 tablet by mouth daily.  . naproxen (NAPROSYN) 375 MG tablet Take 1.5 tablets (562.5 mg total) by mouth 2 (two) times daily as needed.  Marland Kitchen omeprazole (PRILOSEC) 20 MG capsule TAKE 1 CAPSULE BY MOUTH TWICE DAILY BEFORE A MEAL  . pantoprazole (PROTONIX) 40 MG tablet TAKE 1 TABLET BY MOUTH TWICE DAILY  . polyethylene glycol powder (  QC NATURA-LAX) 17 GM/SCOOP powder TAKE 17 GRAMS EVERY DAY  . RABEprazole (ACIPHEX) 20 MG tablet Take 20 mg by mouth 2 (two) times daily.  . Rimegepant Sulfate (NURTEC) 75 MG TBDP Take 75 mg by mouth daily.  . rizatriptan (MAXALT) 10 MG tablet Take 1 tab at onset of headache, May repeat in 2 hours if needed; max up to 30 mg daily  . sertraline (ZOLOFT) 100 MG tablet Take 1 and 1/2 tablets daily  . sucralfate (CARAFATE) 1 g tablet TAKE 1 TABLET BY MOUTH 4 TIMES DAILY(WITH MEALS AND AT BEDTIME)  . temazepam (RESTORIL) 30 MG capsule Take 1 capsule (30 mg total) by mouth at bedtime as needed.  . Verapamil HCl CR 200 MG CP24 Take 1 capsule (200 mg total) by mouth daily.  Marland Kitchen  azithromycin (ZITHROMAX) 250 MG tablet Take 2 tablets by mouth today, then 1 tablet by mouth daily. (Patient not taking: Reported on 02/25/2020)   No facility-administered medications prior to visit.    Review of Systems  Constitutional: Negative for chills and diaphoresis.  HENT: Positive for hoarse voice and sinus pressure. Negative for congestion, ear pain, sneezing and sore throat.   Respiratory: Positive for cough. Negative for shortness of breath.   Musculoskeletal: Negative for neck pain.  Neurological: Positive for headaches.       Objective    BP 128/84 (BP Location: Right Arm, Patient Position: Sitting, Cuff Size: Normal)   Pulse 89   Temp 98.2 F (36.8 C) (Oral)   Wt 174 lb 9.6 oz (79.2 kg)   BMI 29.97 kg/m  BP Readings from Last 3 Encounters:  09/16/20 128/84  02/25/20 124/84  10/31/19 129/82   Wt Readings from Last 3 Encounters:  09/16/20 174 lb 9.6 oz (79.2 kg)  10/31/19 180 lb (81.6 kg)  05/24/19 180 lb (81.6 kg)      Physical Exam Vitals reviewed.  Constitutional:      Appearance: Normal appearance.  HENT:     Head: Normocephalic and atraumatic.     Comments: Mild bilateral maxillary sinus tenderness    Right Ear: Tympanic membrane and external ear normal.     Left Ear: Tympanic membrane and external ear normal.     Nose: Nose normal.  Eyes:     General: No scleral icterus.    Conjunctiva/sclera: Conjunctivae normal.  Cardiovascular:     Rate and Rhythm: Normal rate and regular rhythm.     Heart sounds: Normal heart sounds.  Pulmonary:     Breath sounds: Normal breath sounds.  Abdominal:     Palpations: Abdomen is soft.  Skin:    General: Skin is warm and dry.  Neurological:     General: No focal deficit present.     Mental Status: She is alert and oriented to person, place, and time.  Psychiatric:        Mood and Affect: Mood normal.        Behavior: Behavior normal.        Thought Content: Thought content normal.        Judgment:  Judgment normal.       No results found for any visits on 09/16/20.  Assessment & Plan     1. Sinusitis, unspecified chronicity, unspecified location She had negative Covid test.  Patient advised also to take Robitussin twice a day.  Push fluids. I think a lot of this is just from seasonal allergies. - azithromycin (ZITHROMAX) 250 MG tablet; TAKE AS DIRECTED.  Dispense: 6 tablet; Refill:  0  2. Adjustment disorder with mixed anxiety and depressed mood Patient very anxious.  I will not push any benzodiazepines at all.  She needs counseling or to change jobs.  3. Intractable migraine with aura without status migrainosus Try to get Nurtec approved  4. Mood disorder (HCC) See #2  5. Overweight (BMI 25.0-29.9) Patient no longer obese.  BMI 29.  Continue to work on diet and exercise habits.   No follow-ups on file.      I, Megan Mans, MD, have reviewed all documentation for this visit. The documentation on 09/17/20 for the exam, diagnosis, procedures, and orders are all accurate and complete.    Wylan Gentzler Wendelyn Breslow, MD  [facility] [phone] (phone) [phone] (fax)  [facility]

## 2020-09-16 NOTE — Telephone Encounter (Signed)
Patient was advised that lab requisition has been placed up front.

## 2020-09-17 LAB — COMPREHENSIVE METABOLIC PANEL
ALT: 16 IU/L (ref 0–32)
AST: 18 IU/L (ref 0–40)
Albumin/Globulin Ratio: 1.5 (ref 1.2–2.2)
Albumin: 4.3 g/dL (ref 3.8–4.9)
Alkaline Phosphatase: 86 IU/L (ref 44–121)
BUN/Creatinine Ratio: 21 (ref 9–23)
BUN: 14 mg/dL (ref 6–24)
Bilirubin Total: <0.2 mg/dL (ref 0.0–1.2)
CO2: 26 mmol/L (ref 20–29)
Calcium: 9.5 mg/dL (ref 8.7–10.2)
Chloride: 100 mmol/L (ref 96–106)
Creatinine, Ser: 0.66 mg/dL (ref 0.57–1.00)
GFR calc Af Amer: 114 mL/min/{1.73_m2} (ref >59)
GFR calc non Af Amer: 99 mL/min/{1.73_m2} (ref >59)
Globulin, Total: 2.8 g/dL (ref 1.5–4.5)
Glucose: 84 mg/dL (ref 65–99)
Potassium: 4.6 mmol/L (ref 3.5–5.2)
Sodium: 140 mmol/L (ref 134–144)
Total Protein: 7.1 g/dL (ref 6.0–8.5)

## 2020-09-17 LAB — LIPID PANEL
Chol/HDL Ratio: 3.6 ratio (ref 0.0–4.4)
Cholesterol, Total: 184 mg/dL (ref 100–199)
HDL: 51 mg/dL (ref >39)
LDL Chol Calc (NIH): 113 mg/dL — ABNORMAL HIGH (ref 0–99)
Triglycerides: 114 mg/dL (ref 0–149)
VLDL Cholesterol Cal: 20 mg/dL (ref 5–40)

## 2020-09-17 LAB — CBC WITH DIFFERENTIAL/PLATELET
Basophils Absolute: 0 10*3/uL (ref 0.0–0.2)
Basos: 1 %
EOS (ABSOLUTE): 0.1 10*3/uL (ref 0.0–0.4)
Eos: 2 %
Hematocrit: 39.9 % (ref 34.0–46.6)
Hemoglobin: 12.6 g/dL (ref 11.1–15.9)
Immature Grans (Abs): 0 10*3/uL (ref 0.0–0.1)
Immature Granulocytes: 0 %
Lymphocytes Absolute: 2.2 10*3/uL (ref 0.7–3.1)
Lymphs: 35 %
MCH: 25.8 pg — ABNORMAL LOW (ref 26.6–33.0)
MCHC: 31.6 g/dL (ref 31.5–35.7)
MCV: 82 fL (ref 79–97)
Monocytes Absolute: 0.5 10*3/uL (ref 0.1–0.9)
Monocytes: 8 %
Neutrophils Absolute: 3.5 10*3/uL (ref 1.4–7.0)
Neutrophils: 54 %
Platelets: 298 10*3/uL (ref 150–450)
RBC: 4.88 x10E6/uL (ref 3.77–5.28)
RDW: 13.6 % (ref 11.7–15.4)
WBC: 6.4 10*3/uL (ref 3.4–10.8)

## 2020-09-17 LAB — TSH: TSH: 2.47 u[IU]/mL (ref 0.450–4.500)

## 2020-09-18 ENCOUNTER — Telehealth: Payer: Self-pay

## 2020-09-18 NOTE — Telephone Encounter (Signed)
-----   Message from Maple Hudson., MD sent at 09/18/2020  9:15 AM EDT ----- Labs all in normal range.

## 2020-09-18 NOTE — Telephone Encounter (Signed)
Patient has been advised of lab reports she states her question for you is if her labs are normal why is she continuing to gain weight. Patient states that she follows a very strict diet and wants to know if labs can tell if her metabolism is slow? KW

## 2020-09-22 ENCOUNTER — Ambulatory Visit: Payer: 59 | Admitting: Obstetrics and Gynecology

## 2020-09-23 ENCOUNTER — Ambulatory Visit: Payer: BLUE CROSS/BLUE SHIELD | Admitting: Dermatology

## 2020-09-24 ENCOUNTER — Other Ambulatory Visit: Payer: Self-pay

## 2020-09-24 ENCOUNTER — Other Ambulatory Visit: Payer: Self-pay | Admitting: *Deleted

## 2020-09-24 ENCOUNTER — Encounter: Payer: Self-pay | Admitting: Family Medicine

## 2020-09-24 ENCOUNTER — Ambulatory Visit: Payer: 59 | Admitting: Family Medicine

## 2020-09-24 VITALS — BP 122/68 | HR 92 | Temp 99.1°F | Resp 16 | Wt 173.0 lb

## 2020-09-24 DIAGNOSIS — Z23 Encounter for immunization: Secondary | ICD-10-CM | POA: Diagnosis not present

## 2020-09-24 DIAGNOSIS — Z111 Encounter for screening for respiratory tuberculosis: Secondary | ICD-10-CM | POA: Diagnosis not present

## 2020-09-24 DIAGNOSIS — J320 Chronic maxillary sinusitis: Secondary | ICD-10-CM

## 2020-09-24 DIAGNOSIS — Z1159 Encounter for screening for other viral diseases: Secondary | ICD-10-CM

## 2020-09-24 DIAGNOSIS — Z139 Encounter for screening, unspecified: Secondary | ICD-10-CM | POA: Diagnosis not present

## 2020-09-24 DIAGNOSIS — F419 Anxiety disorder, unspecified: Secondary | ICD-10-CM

## 2020-09-24 DIAGNOSIS — A048 Other specified bacterial intestinal infections: Secondary | ICD-10-CM

## 2020-09-24 MED ORDER — OMEPRAZOLE 20 MG PO CPDR: DELAYED_RELEASE_CAPSULE | ORAL | 1 refills | Status: DC

## 2020-09-24 MED ORDER — GABAPENTIN 600 MG PO TABS
600.0000 mg | ORAL_TABLET | Freq: Every day | ORAL | 1 refills | Status: DC
Start: 2020-09-24 — End: 2021-02-23

## 2020-09-24 MED ORDER — ALPRAZOLAM 0.5 MG PO TABS: ORAL_TABLET | ORAL | 1 refills | Status: DC
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
## 2020-09-26 ENCOUNTER — Telehealth: Payer: Self-pay | Admitting: Family Medicine

## 2020-09-26 ENCOUNTER — Other Ambulatory Visit: Payer: Self-pay | Admitting: Family Medicine

## 2020-09-26 DIAGNOSIS — J329 Chronic sinusitis, unspecified: Secondary | ICD-10-CM

## 2020-09-26 LAB — MEASLES/MUMPS/RUBELLA IMMUNITY
MUMPS ABS, IGG: 134 AU/mL (ref >10.9)
RUBEOLA AB, IGG: 134 AU/mL (ref >16.4)
Rubella Antibodies, IGG: 21.3 index (ref >0.99)

## 2020-09-26 LAB — HEPATITIS A ANTIBODY, IGM: Hep A IgM: NEGATIVE

## 2020-09-26 LAB — HEPATITIS B SURFACE ANTIBODY,QUALITATIVE: Hep B Surface Ab, Qual: REACTIVE

## 2020-09-26 LAB — QUANTIFERON-TB GOLD PLUS
QuantiFERON Mitogen Value: >10 IU/mL
QuantiFERON Nil Value: 0.03 IU/mL
QuantiFERON TB1 Ag Value: 0.09 IU/mL
QuantiFERON TB2 Ag Value: 0.03 IU/mL
QuantiFERON-TB Gold Plus: NEGATIVE

## 2020-09-26 LAB — HEPATITIS B SURFACE ANTIGEN: Hepatitis B Surface Ag: NEGATIVE

## 2020-09-26 NOTE — Telephone Encounter (Signed)
Patient was seen by PCP on 10/6/2021and states another round of antibiotics was suppose to be sent in, patient states sinus symptoms have not improved and would like a follow up when prescription is sent in today.    [facility] - [place], [place] - [address] Phone:  [phone]  Fax:  [phone]

## 2020-09-29 MED ORDER — AMOXICILLIN-POT CLAVULANATE 875-125 MG PO TABS: 1.0000 | ORAL_TABLET | Freq: Two times a day (BID) | ORAL | 0 refills | Status: DC

## 2020-09-29 NOTE — Addendum Note (Signed)
Addended by: Bosie Clos on: 09/29/2020 09:49 AM   Modules accepted: Orders

## 2020-09-30 ENCOUNTER — Telehealth: Payer: Self-pay

## 2020-09-30 NOTE — Telephone Encounter (Signed)
Patient was advised form was ready to pick up.

## 2020-09-30 NOTE — Telephone Encounter (Signed)
Copied from CRM [id number]. Topic: General - Other >> Sep 30, 2020  8:07 AM Elizabeth Munoz wrote: Reason for CRM: After advising Pt that nurse needs to check if TB test came back/ Pt wanted to know how long until she can come get paperwork. Pt states she took off from work for a couple hrs because her employer needs the paperwork she dropped of last week and she would like to come pick it up/ please call pt asap

## 2020-10-02 ENCOUNTER — Ambulatory Visit (LOCAL_COMMUNITY_HEALTH_CENTER): Payer: 59

## 2020-10-02 ENCOUNTER — Ambulatory Visit: Payer: 59

## 2020-10-02 ENCOUNTER — Other Ambulatory Visit: Payer: Self-pay

## 2020-10-02 DIAGNOSIS — Z23 Encounter for immunization: Secondary | ICD-10-CM | POA: Diagnosis not present

## 2020-10-05 NOTE — Progress Notes (Signed)
MMR given; tolerated well Aileen Fass, RN

## 2020-10-06 ENCOUNTER — Ambulatory Visit: Payer: Self-pay

## 2021-01-02 ENCOUNTER — Telehealth: Payer: Self-pay | Admitting: Obstetrics and Gynecology

## 2021-01-02 DIAGNOSIS — Z1231 Encounter for screening mammogram for malignant neoplasm of breast: Secondary | ICD-10-CM

## 2021-01-02 NOTE — Telephone Encounter (Signed)
Pt aware.

## 2021-01-02 NOTE — Telephone Encounter (Signed)
Pt states she needs mammo order. Pt gets at [facility] so can go ahead and schedule. Was due 6/21. Pls notify pt. Thx.

## 2021-01-02 NOTE — Addendum Note (Signed)
Addended by: Althea Grimmer B on: 01/02/2021 03:31 PM   Modules accepted: Orders

## 2021-02-23 ENCOUNTER — Other Ambulatory Visit: Payer: Self-pay | Admitting: Family Medicine

## 2021-04-08 ENCOUNTER — Other Ambulatory Visit: Payer: Self-pay | Admitting: Family Medicine

## 2021-04-08 DIAGNOSIS — F4323 Adjustment disorder with mixed anxiety and depressed mood: Secondary | ICD-10-CM

## 2021-04-08 DIAGNOSIS — F419 Anxiety disorder, unspecified: Secondary | ICD-10-CM

## 2021-04-08 DIAGNOSIS — E785 Hyperlipidemia, unspecified: Secondary | ICD-10-CM

## 2021-04-08 NOTE — Telephone Encounter (Signed)
Requested medication (s) are due for refill today: no  Requested medication (s) are on the active medication list: yes  Future visit scheduled: no  Notes to clinic:  medication requested or expired  Patient overdue for follow up    Requested Prescriptions  Pending Prescriptions Disp Refills   sertraline (ZOLOFT) 100 MG tablet [Pharmacy Med Name: SERTRALINE HCL 100 MG TAB] 135 tablet 0    Sig: TAKE ONE AND A HALF TABLETS BY MOUTH EVERY DAY      Psychiatry:  Antidepressants - SSRI Failed - 04/08/2021 10:03 AM      Failed - Valid encounter within last 6 months    Recent Outpatient Visits           6 months ago Screening examination for rubella   [facility] Maple Hudson., MD   6 months ago Sinusitis, unspecified chronicity, unspecified location   [facility] Maple Hudson., MD   1 year ago Hyperlipidemia, unspecified hyperlipidemia type   [facility] Maple Hudson., MD   1 year ago Acute otitis media, unspecified otitis media type   [facility] Maple Hudson., MD   1 year ago Plantar fasciitis of right foot   [facility] Maple Hudson., MD                  ALPRAZolam [name]) 0.5 MG tablet [Pharmacy Med Name: ALPRAZOLAM 0.5 MG TAB] 30 tablet     Sig: TAKE ONE TABLET EVERY DAY NEEDS APPT FORREFILLS      Not Delegated - Psychiatry:  Anxiolytics/Hypnotics Failed - 04/08/2021 10:03 AM      Failed - This refill cannot be delegated      Failed - Urine Drug Screen completed in last 360 days      Failed - Valid encounter within last 6 months    Recent Outpatient Visits           6 months ago Screening examination for rubella   [facility] Maple Hudson., MD   6 months ago Sinusitis, unspecified chronicity, unspecified location   [facility] Maple Hudson., MD   1 year ago Hyperlipidemia, unspecified hyperlipidemia  type   [facility] Maple Hudson., MD   1 year ago Acute otitis media, unspecified otitis media type   [facility] Maple Hudson., MD   1 year ago Plantar fasciitis of right foot   [facility] Maple Hudson., MD

## 2021-05-13 ENCOUNTER — Other Ambulatory Visit: Payer: Self-pay | Admitting: Family Medicine

## 2021-05-14 ENCOUNTER — Other Ambulatory Visit: Payer: Self-pay | Admitting: Family Medicine

## 2021-06-09 ENCOUNTER — Other Ambulatory Visit: Payer: Self-pay | Admitting: Family Medicine

## 2021-06-09 NOTE — Telephone Encounter (Signed)
  Notes to clinic: script requested is expired  Review for continued use and refill    Requested Prescriptions  Pending Prescriptions Disp Refills   temazepam (RESTORIL) 30 MG capsule [Pharmacy Med Name: TEMAZEPAM 30 MG CAP] 30 capsule     Sig: TAKE 1 CAPSULE BY MOUTH AT BEDTIME AS NEEDED      Not Delegated - Psychiatry:  Anxiolytics/Hypnotics Failed - 06/09/2021  8:01 AM      Failed - This refill cannot be delegated      Failed - Urine Drug Screen completed in last 360 days      Failed - Valid encounter within last 6 months    Recent Outpatient Visits           8 months ago Screening examination for rubella   [facility] Maple Hudson., MD   8 months ago Sinusitis, unspecified chronicity, unspecified location   [facility] Maple Hudson., MD   1 year ago Hyperlipidemia, unspecified hyperlipidemia type   [facility] Maple Hudson., MD   1 year ago Acute otitis media, unspecified otitis media type   [facility] Maple Hudson., MD   2 years ago Plantar fasciitis of right foot   [facility] Maple Hudson., MD

## 2021-06-13 ENCOUNTER — Other Ambulatory Visit: Payer: Self-pay | Admitting: Family Medicine

## 2021-06-13 ENCOUNTER — Other Ambulatory Visit: Payer: Self-pay | Admitting: Obstetrics and Gynecology

## 2021-06-13 DIAGNOSIS — F419 Anxiety disorder, unspecified: Secondary | ICD-10-CM

## 2021-06-13 DIAGNOSIS — G43839 Menstrual migraine, intractable, without status migrainosus: Secondary | ICD-10-CM

## 2021-07-09 ENCOUNTER — Other Ambulatory Visit: Payer: Self-pay | Admitting: Family Medicine

## 2021-07-10 ENCOUNTER — Telehealth: Payer: Self-pay | Admitting: Family Medicine

## 2021-07-10 MED ORDER — POLYETHYLENE GLYCOL 3350 17 GM/SCOOP PO POWD: ORAL | 6 refills | Status: AC

## 2021-07-10 NOTE — Telephone Encounter (Signed)
Rx sent to pharmacy   

## 2021-07-10 NOTE — Telephone Encounter (Signed)
[facility] faxed refill request for the following medications:   polyethylene glycol powder (QC NATURA-LAX) 17 GM/SCOOP powder    Please advise.

## 2021-08-25 ENCOUNTER — Other Ambulatory Visit: Payer: Self-pay | Admitting: Family Medicine

## 2021-08-25 DIAGNOSIS — K219 Gastro-esophageal reflux disease without esophagitis: Secondary | ICD-10-CM

## 2021-09-09 ENCOUNTER — Other Ambulatory Visit: Payer: Self-pay | Admitting: Family Medicine

## 2021-09-09 DIAGNOSIS — F4323 Adjustment disorder with mixed anxiety and depressed mood: Secondary | ICD-10-CM

## 2021-09-09 DIAGNOSIS — E785 Hyperlipidemia, unspecified: Secondary | ICD-10-CM

## 2021-09-09 NOTE — Telephone Encounter (Signed)
Requested medication (s) are due for refill today- yes  Requested medication (s) are on the active medication list -yes  Future visit scheduled -no  Last refill: 04/09/21  Notes to clinic: Attempted to call patient to schedule appointment- left message to call office- request sent for review   Requested Prescriptions  Pending Prescriptions Disp Refills   sertraline (ZOLOFT) 100 MG tablet [Pharmacy Med Name: SERTRALINE HCL 100 MG TAB] 135 tablet 0    Sig: TAKE ONE AND A HALF TABLETS BY MOUTH EVERY DAY     Psychiatry:  Antidepressants - SSRI Failed - 09/09/2021  8:19 AM      Failed - Valid encounter within last 6 months    Recent Outpatient Visits           11 months ago Screening examination for rubella   [facility] Maple Hudson., MD   11 months ago Sinusitis, unspecified chronicity, unspecified location   [facility] Maple Hudson., MD   1 year ago Hyperlipidemia, unspecified hyperlipidemia type   [facility] Maple Hudson., MD   1 year ago Acute otitis media, unspecified otitis media type   [facility] Maple Hudson., MD   2 years ago Plantar fasciitis of right foot   [facility] Maple Hudson., MD                 Requested Prescriptions  Pending Prescriptions Disp Refills   sertraline (ZOLOFT) 100 MG tablet [Pharmacy Med Name: SERTRALINE HCL 100 MG TAB] 135 tablet 0    Sig: TAKE ONE AND A HALF TABLETS BY MOUTH EVERY DAY     Psychiatry:  Antidepressants - SSRI Failed - 09/09/2021  8:19 AM      Failed - Valid encounter within last 6 months    Recent Outpatient Visits           11 months ago Screening examination for rubella   [facility] Maple Hudson., MD   11 months ago Sinusitis, unspecified chronicity, unspecified location   [facility] Maple Hudson., MD   1 year ago Hyperlipidemia, unspecified  hyperlipidemia type   [facility] Maple Hudson., MD   1 year ago Acute otitis media, unspecified otitis media type   [facility] Maple Hudson., MD   2 years ago Plantar fasciitis of right foot   [facility] Maple Hudson., MD

## 2021-10-01 ENCOUNTER — Other Ambulatory Visit: Payer: Self-pay | Admitting: *Deleted

## 2021-10-01 DIAGNOSIS — Z111 Encounter for screening for respiratory tuberculosis: Secondary | ICD-10-CM

## 2021-10-03 ENCOUNTER — Other Ambulatory Visit: Payer: Self-pay | Admitting: Family Medicine

## 2021-10-03 NOTE — Telephone Encounter (Signed)
unable to RF - needs appt- pt has one scheduled and enough med to last

## 2021-10-05 ENCOUNTER — Other Ambulatory Visit: Payer: Self-pay | Admitting: Family Medicine

## 2021-10-05 NOTE — Telephone Encounter (Signed)
Copied from CRM [id number]. Topic: Quick Communication - Rx Refill/Question >> Oct 05, 2021  2:41 PM Jaquita Rector A wrote: Medication: gabapentin (NEURONTIN) 600 MG tablet  Will be out by next appointment  Has the patient contacted their pharmacy? No. Was scheduled to be seen on 10/08/21 but due to circumstances had to reschedule and will be out by appointment date  (Agent: If no, request that the patient contact the pharmacy for the refill.) (Agent: If yes, when and what did the pharmacy advise?)  Preferred Pharmacy (with phone number or street name): [facility] - [place], [place] - [address]  Phone:  [phone] Fax:  [phone]    Has the patient been seen for an appointment in the last year OR does the patient have an upcoming appointment? Yes.    Agent: Please be advised that RX refills may take up to 3 business days. We ask that you follow-up with your pharmacy.

## 2021-10-06 MED ORDER — GABAPENTIN 600 MG PO TABS: 600.0000 mg | ORAL_TABLET | Freq: Every day | ORAL | 0 refills | Status: DC

## 2021-10-06 NOTE — Telephone Encounter (Signed)
Requested medication (s) are due for refill today: Yes  Requested medication (s) are on the active medication list: Yes  Last refill:  07/09/21  Future visit scheduled: Yes  Notes to clinic:  See request,recently refused.    Requested Prescriptions  Pending Prescriptions Disp Refills   gabapentin (NEURONTIN) 600 MG tablet 90 tablet 0    Sig: Take 1 tablet (600 mg total) by mouth at bedtime.     Neurology: Anticonvulsants - gabapentin Failed - 10/05/2021  4:51 PM      Failed - Valid encounter within last 12 months    Recent Outpatient Visits           1 year ago Screening examination for rubella   [facility] Maple Hudson., MD   1 year ago Sinusitis, unspecified chronicity, unspecified location   [facility] Maple Hudson., MD   1 year ago Hyperlipidemia, unspecified hyperlipidemia type   [facility] Maple Hudson., MD   1 year ago Acute otitis media, unspecified otitis media type   [facility] Maple Hudson., MD   2 years ago Plantar fasciitis of right foot   [facility] Maple Hudson., MD       Future Appointments             In 3 weeks Maple Hudson., MD [facility]

## 2021-10-08 ENCOUNTER — Ambulatory Visit: Payer: 59 | Admitting: Family Medicine

## 2021-10-09 ENCOUNTER — Telehealth: Payer: Self-pay

## 2021-10-09 LAB — QUANTIFERON-TB GOLD PLUS
QuantiFERON Mitogen Value: >10 IU/mL
QuantiFERON Nil Value: 0.05 IU/mL
QuantiFERON TB1 Ag Value: 0.05 IU/mL
QuantiFERON TB2 Ag Value: 0.06 IU/mL
QuantiFERON-TB Gold Plus: NEGATIVE

## 2021-10-09 NOTE — Telephone Encounter (Signed)
-----   Message from Maple Hudson., MD sent at 10/09/2021  7:54 AM EDT ----- No TB please advise patient.

## 2021-10-09 NOTE — Telephone Encounter (Signed)
Mr. Laurine Blazer advised.  He is wanting to know if pt's health forms have been completed yet.   Thanks,   -Vernona Rieger

## 2021-10-09 NOTE — Telephone Encounter (Signed)
Mr. Elizabeth Munoz advised form is ready to pick up.

## 2021-10-12 ENCOUNTER — Other Ambulatory Visit: Payer: Self-pay

## 2021-10-12 ENCOUNTER — Encounter: Payer: Self-pay | Admitting: Family Medicine

## 2021-10-12 ENCOUNTER — Ambulatory Visit: Payer: 59 | Admitting: Physician Assistant

## 2021-10-12 ENCOUNTER — Ambulatory Visit: Payer: 59 | Admitting: Family Medicine

## 2021-10-12 VITALS — BP 143/88 | HR 81 | Temp 98.5°F | Resp 18 | Wt 178.0 lb

## 2021-10-12 DIAGNOSIS — G43119 Migraine with aura, intractable, without status migrainosus: Secondary | ICD-10-CM

## 2021-10-12 DIAGNOSIS — H6502 Acute serous otitis media, left ear: Secondary | ICD-10-CM

## 2021-10-12 MED ORDER — GABAPENTIN 600 MG PO TABS: 600.0000 mg | ORAL_TABLET | Freq: Every day | ORAL | 0 refills | Status: DC

## 2021-10-12 MED ORDER — AMOXICILLIN 500 MG PO CAPS: 1000.0000 mg | ORAL_CAPSULE | Freq: Two times a day (BID) | ORAL | 0 refills | Status: DC

## 2021-10-12 NOTE — Progress Notes (Signed)
Established patient visit   Patient: Elizabeth Munoz   DOB: 12/10/1966   55 y.o. Female  MRN: [id number] Visit Date: 10/12/2021  Today's healthcare provider: Mila Merry, MD   Chief Complaint  Patient presents with   Ear Fullness    Subjective    HPI  Ear fullness: Patient complains of fullness in the left ear for the past 4 days. Associated symptoms includes dizziness. Symptoms have worsened since onset. She denies any ear drainage or pain. She states "it feels like my ear is stopped up". She has tried taking Mucinex and Vitamin C. Patient reports that she had sinus drainage 1 week ago.     Medications: Outpatient Medications Prior to Visit  Medication Sig   ALPRAZolam (XANAX) 0.5 MG tablet TAKE ONE TABLET EVERY DAY NEEDS APPT FORREFILLS   butalbital-acetaminophen-caffeine (FIORICET, ESGIC) 50-325-40 MG tablet TAKE ONE TO TWO TABLETS EVERY FOUR HOURSAS NEEDED FOR HEADACHE   cetirizine (ZYRTEC) 10 MG tablet Take 1 tablet (10 mg total) by mouth daily.   dexlansoprazole (DEXILANT) 60 MG capsule Take by mouth.   diclofenac (VOLTAREN) 75 MG EC tablet Take 75 mg by mouth 2 (two) times daily.   fluticasone (FLONASE) 50 MCG/ACT nasal spray USE 2 SPRAYS EACH NOSTRIL DAILY   furosemide (LASIX) 20 MG tablet Take by mouth.   gabapentin (NEURONTIN) 600 MG tablet Take 1 tablet (600 mg total) by mouth at bedtime.   meloxicam (MOBIC) 15 MG tablet Take 1 tablet (15 mg total) by mouth daily.   metoCLOPramide (REGLAN) 5 MG tablet TAKE 1 TABLET BY MOUTH 3 TIMES DAILY BEFORE MEALS   Multiple Vitamin (MULTIVITAMIN) tablet Take 1 tablet by mouth daily.   naproxen (NAPROSYN) 375 MG tablet Take 1.5 tablets (562.5 mg total) by mouth 2 (two) times daily as needed.   omeprazole (PRILOSEC) 20 MG capsule TAKE 1 CAPSULE BY MOUTH TWICE DAILY BEFORE A MEAL   pantoprazole (PROTONIX) 40 MG tablet TAKE 1 TABLET BY MOUTH TWICE DAILY   polyethylene glycol powder (QC NATURA-LAX) 17 GM/SCOOP powder TAKE  17 GRAMS EVERY DAY   RABEprazole (ACIPHEX) 20 MG tablet Take 20 mg by mouth 2 (two) times daily.   Rimegepant Sulfate (NURTEC) 75 MG TBDP Take 75 mg by mouth daily.   rizatriptan (MAXALT) 10 MG tablet Take 1 tab at onset of headache, May repeat in 2 hours if needed; max up to 30 mg daily   sertraline (ZOLOFT) 100 MG tablet TAKE ONE AND A HALF TABLETS BY MOUTH EVERY DAY   sucralfate (CARAFATE) 1 g tablet TAKE 1 TABLET BY MOUTH 4 TIMES DAILY(WITH MEALS AND AT BEDTIME)   temazepam (RESTORIL) 30 MG capsule TAKE 1 CAPSULE BY MOUTH AT BEDTIME AS NEEDED   Verapamil HCl CR 200 MG CP24 Take 1 capsule (200 mg total) by mouth daily.   [DISCONTINUED] azithromycin (ZITHROMAX) 250 MG tablet TAKE AS DIRECTED.   [DISCONTINUED] amoxicillin-clavulanate (AUGMENTIN) 875-125 MG tablet Take 1 tablet by mouth 2 (two) times daily. (Patient not taking: Reported on 10/12/2021)   [DISCONTINUED] azithromycin (ZITHROMAX) 250 MG tablet Take 2 tablets by mouth today, then 1 tablet by mouth daily. (Patient not taking: Reported on 10/12/2021)   No facility-administered medications prior to visit.    Review of Systems  Constitutional:  Negative for appetite change, chills, fatigue and fever.  HENT:         Left ear fullness  Respiratory:  Negative for chest tightness and shortness of breath.   Cardiovascular:  Negative for chest  pain and palpitations.  Gastrointestinal:  Negative for abdominal pain, nausea and vomiting.  Neurological:  Positive for dizziness. Negative for weakness.      Objective    BP (!) 143/88 (BP Location: Right Arm, Patient Position: Sitting, Cuff Size: Large)   Pulse 81   Temp 98.5 F (36.9 C) (Oral)   Resp 18   Wt 178 lb (80.7 kg)   LMP  (Within Months) Comment: 12/2020  SpO2 100% Comment: room air  BMI 30.55 kg/m    Physical Exam  General Appearance:    Overweight female, alert, cooperative, in no acute distress  HENT:   Left TM dull and bulging. Right TM pearly white.   Eyes:     PERRL, conjunctiva/corneas clear, EOM's intact       Lungs:     Clear to auscultation bilaterally, respirations unlabored  Heart:    Normal heart rate. Normal rhythm. No murmurs, rubs, or gallops.    Neurologic:   Awake, alert, oriented x 3. No apparent focal neurological           defect.         Assessment & Plan     1. Non-recurrent acute serous otitis media of left ear  - amoxicillin (AMOXIL) 500 MG capsule; Take 2 capsules (1,000 mg total) by mouth 2 (two) times daily for 10 days.  Dispense: 40 capsule; Refill: 0  Call if symptoms change or if not rapidly improving.    2. Intractable migraine with aura without status migrainosus  - gabapentin (NEURONTIN) 600 MG tablet; Take 1 tablet (600 mg total) by mouth at bedtime.  Dispense: 90 tablet; Refill: 0         The entirety of the information documented in the History of Present Illness, Review of Systems and Physical Exam were personally obtained by me. Portions of this information were initially documented by the CMA and reviewed by me for thoroughness and accuracy.     Mila Merry, MD  [facility] [phone] (phone) [phone] (fax)  [facility]

## 2021-10-13 ENCOUNTER — Ambulatory Visit: Payer: Self-pay

## 2021-10-13 NOTE — Telephone Encounter (Signed)
Pts husband called in regarding the pt, stated pt was just seen yesterday 10/24 for an ear infection and that the pt is getting worse today, caller wanted to speak with a nurse about the pts worsening symptoms.      Pt. Seen yesterday for left ear pain. Today has headache, left ear feels full, dizziness, nausea and vomiting. Started antibiotic yesterday. Please advise. Okey Regal in the practice notified.   Answer Assessment - Initial Assessment Questions 1. LOCATION: "Which ear is involved?"     Left  2. ONSET: "When did the ear start hurting"      Yesterday 3. SEVERITY: "How bad is the pain?"  (Scale 1-10; mild, moderate or severe)   - MILD (1-3): doesn't interfere with normal activities    - MODERATE (4-7): interferes with normal activities or awakens from sleep    - SEVERE (8-10): excruciating pain, unable to do any normal activities      Feels full 4. URI SYMPTOMS: "Do you have a runny nose or cough?"     No 5. FEVER: "Do you have a fever?" If Yes, ask: "What is your temperature, how was it measured, and when did it start?"     No 6. CAUSE: "Have you been swimming recently?", "How often do you use Q-TIPS?", "Have you had any recent air travel or scuba diving?"     No 7. OTHER SYMPTOMS: "Do you have any other symptoms?" (e.g., headache, stiff neck, dizziness, vomiting, runny nose, decreased hearing)     Dizzy, headache, vomiting 8. PREGNANCY: "Is there any chance you are pregnant?" "When was your last menstrual period?"     No  Protocols used: Davina Poke

## 2021-10-29 ENCOUNTER — Other Ambulatory Visit: Payer: Self-pay

## 2021-10-29 ENCOUNTER — Ambulatory Visit (INDEPENDENT_AMBULATORY_CARE_PROVIDER_SITE_OTHER): Payer: BC Managed Care – PPO | Admitting: Family Medicine

## 2021-10-29 ENCOUNTER — Encounter: Payer: Self-pay | Admitting: Family Medicine

## 2021-10-29 VITALS — BP 123/84 | HR 88 | Temp 98.1°F | Wt 179.0 lb

## 2021-10-29 DIAGNOSIS — F4323 Adjustment disorder with mixed anxiety and depressed mood: Secondary | ICD-10-CM

## 2021-10-29 DIAGNOSIS — K219 Gastro-esophageal reflux disease without esophagitis: Secondary | ICD-10-CM | POA: Diagnosis not present

## 2021-10-29 DIAGNOSIS — F39 Unspecified mood [affective] disorder: Secondary | ICD-10-CM | POA: Diagnosis not present

## 2021-10-29 DIAGNOSIS — J301 Allergic rhinitis due to pollen: Secondary | ICD-10-CM

## 2021-10-29 DIAGNOSIS — J329 Chronic sinusitis, unspecified: Secondary | ICD-10-CM

## 2021-10-29 MED ORDER — PREDNISONE 20 MG PO TABS: 20.0000 mg | ORAL_TABLET | Freq: Every day | ORAL | 0 refills | Status: DC

## 2021-10-29 NOTE — Progress Notes (Signed)
Established patient visit   Patient: Elizabeth Munoz   DOB: Apr 30, 1966   55 y.o. Female  MRN: [id number] Visit Date: 10/29/2021  Today's healthcare provider: Megan Mans, MD   Chief Complaint  Patient presents with   Ear Fullness   Dizziness   Subjective    Sinus Problem This is a new problem. The current episode started more than 1 month ago. The problem has been gradually worsening since onset. There has been no fever. Associated symptoms include congestion, ear pain, headaches, sinus pressure, sneezing and a sore throat. Pertinent negatives include no chills, coughing, shortness of breath or swollen glands. Past treatments include antibiotics. The treatment provided no relief.   In looking back she seems to have the same problem every fall. 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  Changes made at last visit include no changes.   She reports excellent compliance with treatment. She reports excellent tolerance of treatment. She is not having side effects.   She feels her anxiety  stable  since last visit.   GAD-7 Results GAD-7 Generalized Anxiety Disorder Screening Tool 09/16/2020 02/25/2020  1. Feeling Nervous, Anxious, or on Edge 3 3  2. Not Being Able to Stop or Control Worrying 3 3  3. Worrying Too Much About Different Things 3 3  4. Trouble Relaxing 2 3  5. Being So Restless it's Hard To Sit Still 2 0  6. Becoming Easily Annoyed or Irritable 3 2  7. Feeling Afraid As If Something Awful Might Happen 3 2  Total GAD-7 Score 19 16  Difficulty At Work, Home, or Getting  Along With Others? - Extremely difficult    PHQ-9 Scores PHQ9 SCORE ONLY 09/16/2020 02/25/2020 12/29/2018  PHQ-9 Total Score 20 23 0     -----------------------------------------------------------------------------------------   Medications: Outpatient Medications Prior to Visit  Medication Sig   ALPRAZolam (XANAX) 0.5 MG tablet TAKE ONE  TABLET EVERY DAY NEEDS APPT FORREFILLS   butalbital-acetaminophen-caffeine (FIORICET, ESGIC) 50-325-40 MG tablet TAKE ONE TO TWO TABLETS EVERY FOUR HOURSAS NEEDED FOR HEADACHE   cetirizine (ZYRTEC) 10 MG tablet Take 1 tablet (10 mg total) by mouth daily.   dexlansoprazole (DEXILANT) 60 MG capsule Take by mouth.   diclofenac (VOLTAREN) 75 MG EC tablet Take 75 mg by mouth 2 (two) times daily.   fluticasone (FLONASE) 50 MCG/ACT nasal spray USE 2 SPRAYS EACH NOSTRIL DAILY   furosemide (LASIX) 20 MG tablet Take by mouth.   gabapentin (NEURONTIN) 600 MG tablet Take 1 tablet (600 mg total) by mouth at bedtime.   meloxicam (MOBIC) 15 MG tablet Take 1 tablet (15 mg total) by mouth daily.   metoCLOPramide (REGLAN) 5 MG tablet TAKE 1 TABLET BY MOUTH 3 TIMES DAILY BEFORE MEALS   Multiple Vitamin (MULTIVITAMIN) tablet Take 1 tablet by mouth daily.   naproxen (NAPROSYN) 375 MG tablet Take 1.5 tablets (562.5 mg total) by mouth 2 (two) times daily as needed.   omeprazole (PRILOSEC) 20 MG capsule TAKE 1 CAPSULE BY MOUTH TWICE DAILY BEFORE A MEAL   pantoprazole (PROTONIX) 40 MG tablet TAKE 1 TABLET BY MOUTH TWICE DAILY   polyethylene glycol powder (QC NATURA-LAX) 17 GM/SCOOP powder TAKE 17 GRAMS EVERY DAY   RABEprazole (ACIPHEX) 20 MG tablet Take 20 mg by mouth 2 (two) times daily.   Rimegepant Sulfate (NURTEC) 75 MG TBDP Take 75 mg by mouth daily.   rizatriptan (MAXALT) 10 MG tablet Take 1 tab  at onset of headache, May repeat in 2 hours if needed; max up to 30 mg daily   sertraline (ZOLOFT) 100 MG tablet TAKE ONE AND A HALF TABLETS BY MOUTH EVERY DAY   sucralfate (CARAFATE) 1 g tablet TAKE 1 TABLET BY MOUTH 4 TIMES DAILY(WITH MEALS AND AT BEDTIME)   temazepam (RESTORIL) 30 MG capsule TAKE 1 CAPSULE BY MOUTH AT BEDTIME AS NEEDED   Verapamil HCl CR 200 MG CP24 Take 1 capsule (200 mg total) by mouth daily.   No facility-administered medications prior to visit.    Review of Systems  Constitutional:  Negative  for appetite change, chills, fatigue and fever.  HENT:  Positive for congestion, ear pain, postnasal drip, rhinorrhea, sinus pressure, sinus pain, sneezing, sore throat and tinnitus. Negative for ear discharge.   Eyes: Negative.   Respiratory: Negative.  Negative for cough, chest tightness and shortness of breath.   Cardiovascular:  Negative for chest pain and palpitations.  Gastrointestinal:  Positive for vomiting. Negative for abdominal pain and nausea.  Neurological:  Positive for dizziness and headaches. Negative for weakness and light-headedness.  Psychiatric/Behavioral:  Negative for decreased concentration, dysphoric mood, self-injury, sleep disturbance and suicidal ideas. The patient is not nervous/anxious.       Objective    BP 123/84 (BP Location: Right Arm, Patient Position: Sitting, Cuff Size: Large)   Pulse 88   Temp 98.1 F (36.7 C) (Oral)   Wt 179 lb (81.2 kg)   SpO2 98%   BMI 30.73 kg/m    Physical Exam Vitals reviewed.  Constitutional:      General: She is not in acute distress.    Appearance: She is well-developed.  HENT:     Head: Normocephalic and atraumatic.     Right Ear: Hearing normal.     Left Ear: Hearing normal.     Nose: Nose normal.  Eyes:     General: Lids are normal. No scleral icterus.       Right eye: No discharge.        Left eye: No discharge.     Conjunctiva/sclera: Conjunctivae normal.  Cardiovascular:     Rate and Rhythm: Normal rate and regular rhythm.     Heart sounds: Normal heart sounds.  Pulmonary:     Effort: Pulmonary effort is normal. No respiratory distress.     Breath sounds: Normal breath sounds.  Musculoskeletal:     Cervical back: Neck supple.  Skin:    Findings: No lesion or rash.  Neurological:     General: No focal deficit present.     Mental Status: She is alert and oriented to person, place, and time.  Psychiatric:        Mood and Affect: Mood normal.        Speech: Speech normal.        Behavior: Behavior  normal.        Thought Content: Thought content normal.        Judgment: Judgment normal.      No results found for any visits on 10/29/21.  Assessment & Plan     1. Adjustment disorder with mixed anxiety and depressed mood Chronic anxiety in this patient.  It is better and she is taking 150 mg of sertraline daily which I would strongly say we continue.  I would like to see her use less of the alprazolam. She has been noncompliant with follow-up in the past and she will need to be seen at least every 6 months if  she wishes to have alprazolam available. 2. Mood disorder (HCC) As above.  She certainly is not suicidal or homicidal.  3. Gastroesophageal reflux disease, unspecified whether esophagitis present On pantoprazole  4. Seasonal allergic rhinitis due to pollen I think seasonal allergies are the reason she is having the symptoms of sinus disease.  Try prednisone 20 mg daily for a week and may need referral to allergist or ENT.  5. Sinusitis, unspecified chronicity, unspecified location No antibiotics indicated at this time.  May need referral as above.   No follow-ups on file.      I, Megan Mans, MD, have reviewed all documentation for this visit. The documentation on 11/04/21 for the exam, diagnosis, procedures, and orders are all accurate and complete.    Jj Enyeart Wendelyn Breslow, MD  [facility] [phone] (phone) [phone] (fax)  [facility]

## 2021-11-04 ENCOUNTER — Other Ambulatory Visit: Payer: Self-pay | Admitting: Family Medicine

## 2021-11-04 DIAGNOSIS — F419 Anxiety disorder, unspecified: Secondary | ICD-10-CM

## 2021-11-04 DIAGNOSIS — J0101 Acute recurrent maxillary sinusitis: Secondary | ICD-10-CM

## 2021-11-04 NOTE — Telephone Encounter (Signed)
Requested medication (s) are due for refill today:   Yes for Flonase, Xanax provider to review  Requested medication (s) are on the active medication list:   Yes for both  Future visit scheduled:   Yes   Last ordered: Flonase 03/12/2019 16 g, 11 refills;    Xanax 06/13/2021  #30, 0 refills  Returned because a new rx is needed for the Flonase,  Xanax is non delegated.   Requested Prescriptions  Pending Prescriptions Disp Refills   fluticasone (FLONASE) 50 MCG/ACT nasal spray [Pharmacy Med Name: FLUTICASONE PROPIONATE 50 MCG/ACT N] 16 g 11    Sig: TAKE 2 SPRAYS INTO EACH NOSTRIL DAILY     Ear, Nose, and Throat: Nasal Preparations - Corticosteroids Passed - 11/04/2021  9:43 AM      Passed - Valid encounter within last 12 months    Recent Outpatient Visits           6 days ago Adjustment disorder with mixed anxiety and depressed mood   [facility] Maple Hudson., MD   3 weeks ago Non-recurrent acute serous otitis media of left ear   [facility] Malva Limes, MD   1 year ago Screening examination for rubella   [facility] Maple Hudson., MD   1 year ago Sinusitis, unspecified chronicity, unspecified location   [facility] Maple Hudson., MD   1 year ago Hyperlipidemia, unspecified hyperlipidemia type   [facility] Maple Hudson., MD       Future Appointments             In 7 months Maple Hudson., MD [facility]             ALPRAZolam [name]) 0.5 MG tablet [Pharmacy Med Name: ALPRAZOLAM 0.5 MG TAB] 30 tablet     Sig: TAKE 1 TABLET BY MOUTH ONCE DAILY *NEED TO SCHEDULE OFFICE VISIT FOR FURTHER REFILLS*     Not Delegated - Psychiatry:  Anxiolytics/Hypnotics Failed - 11/04/2021  9:43 AM      Failed - This refill cannot be delegated      Failed - Urine Drug Screen completed in last 360 days      Passed - Valid encounter within last 6  months    Recent Outpatient Visits           6 days ago Adjustment disorder with mixed anxiety and depressed mood   [facility] Maple Hudson., MD   3 weeks ago Non-recurrent acute serous otitis media of left ear   [facility] Malva Limes, MD   1 year ago Screening examination for rubella   [facility] Maple Hudson., MD   1 year ago Sinusitis, unspecified chronicity, unspecified location   [facility] Maple Hudson., MD   1 year ago Hyperlipidemia, unspecified hyperlipidemia type   [facility] Maple Hudson., MD       Future Appointments             In 7 months Maple Hudson., MD [facility]

## 2021-11-06 ENCOUNTER — Other Ambulatory Visit: Payer: Self-pay | Admitting: Family Medicine

## 2021-11-06 DIAGNOSIS — F4323 Adjustment disorder with mixed anxiety and depressed mood: Secondary | ICD-10-CM

## 2021-11-06 DIAGNOSIS — E785 Hyperlipidemia, unspecified: Secondary | ICD-10-CM

## 2021-11-06 NOTE — Telephone Encounter (Signed)
[facility] called and spoke to Elizabeth, [facility] about the refill(s) Zoloft 100mg  requested. Advised it was sent on 09/09/21 #135/0 refill(s). 09/11/21 advised it was filled on 09/09/21 so pt should still have medication until Dec when she is due to be seen in the office. Will refuse this request.   Requested Prescriptions  Pending Prescriptions Disp Refills   sertraline (ZOLOFT) 100 MG tablet [Pharmacy Med Name: SERTRALINE HCL 100 MG TAB] 135 tablet 0    Sig: TAKE ONE AND A HALF TABLETS BY MOUTH EVERY DAY     Psychiatry:  Antidepressants - SSRI Passed - 11/06/2021  9:14 AM      Passed - Valid encounter within last 6 months    Recent Outpatient Visits           1 week ago Adjustment disorder with mixed anxiety and depressed mood   [facility]   3 weeks ago Non-recurrent acute serous otitis media of left ear   [facility], MD   1 year ago Screening examination for rubella   [facility] MD   1 year ago Sinusitis, unspecified chronicity, unspecified location   [facility]., MD   1 year ago Hyperlipidemia, unspecified hyperlipidemia type   [facility] MD       Future Appointments             In 6 months Maple Hudson., MD [facility]

## 2021-12-07 ENCOUNTER — Ambulatory Visit (INDEPENDENT_AMBULATORY_CARE_PROVIDER_SITE_OTHER): Payer: BC Managed Care – PPO | Admitting: Obstetrics and Gynecology

## 2021-12-07 ENCOUNTER — Encounter: Payer: Self-pay | Admitting: Obstetrics and Gynecology

## 2021-12-07 ENCOUNTER — Other Ambulatory Visit: Payer: Self-pay

## 2021-12-07 VITALS — BP 136/70 | Ht 64.0 in | Wt 182.0 lb

## 2021-12-07 DIAGNOSIS — Z79890 Hormone replacement therapy (postmenopausal): Secondary | ICD-10-CM

## 2021-12-07 DIAGNOSIS — N951 Menopausal and female climacteric states: Secondary | ICD-10-CM | POA: Diagnosis not present

## 2021-12-07 DIAGNOSIS — Z1231 Encounter for screening mammogram for malignant neoplasm of breast: Secondary | ICD-10-CM | POA: Diagnosis not present

## 2021-12-07 MED ORDER — PROGESTERONE MICRONIZED 100 MG PO CAPS: ORAL_CAPSULE | ORAL | 0 refills | Status: DC

## 2021-12-07 MED ORDER — ESTRADIOL 0.5 MG PO TABS: 0.5000 mg | ORAL_TABLET | Freq: Every day | ORAL | 0 refills | Status: DC

## 2021-12-07 NOTE — Progress Notes (Signed)
Elizabeth Munoz, Elizabeth Munoz., MD   Chief Complaint  Patient presents with   Hot Flashes    Severe hot flashes x 11 months, headaches    HPI:      [name] is a 55 y.o. [id number] whose LMP was No LMP recorded. Patient is perimenopausal., presents today for hourly hot flashes and night sweats affecting sleep for close to a yr. Pt very frustrated. Already on gabapentin and zoloft without sx relief. Has never tried HRT. LMP ~1/22, no BTB since. Normal TSH 9/21 with PCP. Neg pap/neg HPV DNA 6/19; [facility] Due for annual.   Hx of migraine headaches, improved since cataract surgery last wk.   Patient Active Problem List   Diagnosis Date Noted   Vasomotor symptoms due to menopause 05/23/2019   Perimenopause 05/29/2018   Adaptation reaction 10/13/2016   H/O pyloric stenosis    Menometrorrhagia 06/22/2015   PMS (premenstrual syndrome) 06/22/2015   Mood disorder (HCC) 06/22/2015   Migraine 06/19/2015   GERD (gastroesophageal reflux disease)     Past Surgical History:  Procedure Laterality Date   BREAST REDUCTION SURGERY     CESAREAN SECTION     x 2   TUBAL LIGATION     UPPER GI ENDOSCOPY     chronic gastritis, LA esophagitis    Family History  Problem Relation Age of Onset   Alzheimer's disease Mother    Diabetes Father    Prostate cancer Father    Diabetes Brother     Social History   Socioeconomic History   Marital status: Single    Spouse name: Not on file   Number of children: Not on file   Years of education: Not on file   Highest education level: Not on file  Occupational History   Not on file  Tobacco Use   Smoking status: Never   Smokeless tobacco: Never  Substance and Sexual Activity   Alcohol use: No    Alcohol/week: 0.0 standard drinks   Drug use: No   Sexual activity: Not Currently    Birth control/protection: Surgical  Other Topics Concern   Not on file  Social History Narrative   Not on file   Social Determinants of  Health   Financial Resource Strain: Not on file  Food Insecurity: Not on file  Transportation Needs: Not on file  Physical Activity: Not on file  Stress: Not on file  Social Connections: Not on file  Intimate Partner Violence: Not on file    Outpatient Medications Prior to Visit  Medication Sig Dispense Refill   ALPRAZolam (XANAX) 0.5 MG tablet TAKE 1 TABLET BY MOUTH ONCE DAILY *NEED TO SCHEDULE OFFICE VISIT FOR FURTHER REFILLS* 30 tablet 4   butalbital-acetaminophen-caffeine (FIORICET, ESGIC) 50-325-40 MG tablet TAKE ONE TO TWO TABLETS EVERY FOUR HOURSAS NEEDED FOR HEADACHE 35 tablet 3   cetirizine (ZYRTEC) 10 MG tablet Take 1 tablet (10 mg total) by mouth daily. 30 tablet 11   dexlansoprazole (DEXILANT) 60 MG capsule Take by mouth.     diclofenac (VOLTAREN) 75 MG EC tablet Take 75 mg by mouth 2 (two) times daily.     fluticasone (FLONASE) 50 MCG/ACT nasal spray TAKE 2 SPRAYS INTO EACH NOSTRIL DAILY 16 g 11   furosemide (LASIX) 20 MG tablet Take by mouth.     gabapentin (NEURONTIN) 600 MG tablet Take 1 tablet (600 mg total) by mouth at bedtime. 90 tablet 0   meloxicam (MOBIC) 15 MG tablet Take 1  tablet (15 mg total) by mouth daily. 30 tablet 0   metoCLOPramide (REGLAN) 5 MG tablet TAKE 1 TABLET BY MOUTH 3 TIMES DAILY BEFORE MEALS 90 tablet 1   Multiple Vitamin (MULTIVITAMIN) tablet Take 1 tablet by mouth daily.     naproxen (NAPROSYN) 375 MG tablet Take 1.5 tablets (562.5 mg total) by mouth 2 (two) times daily as needed. 60 tablet 2   omeprazole (PRILOSEC) 20 MG capsule TAKE 1 CAPSULE BY MOUTH TWICE DAILY BEFORE A MEAL 60 capsule 1   pantoprazole (PROTONIX) 40 MG tablet TAKE 1 TABLET BY MOUTH TWICE DAILY 60 tablet 11   polyethylene glycol powder (QC NATURA-LAX) 17 GM/SCOOP powder TAKE 17 GRAMS EVERY DAY 510 g 6   RABEprazole (ACIPHEX) 20 MG tablet Take 20 mg by mouth 2 (two) times daily.     Rimegepant Sulfate (NURTEC) 75 MG TBDP Take 75 mg by mouth daily. 8 tablet 5   rizatriptan  (MAXALT) 10 MG tablet Take 1 tab at onset of headache, May repeat in 2 hours if needed; max up to 30 mg daily 10 tablet 0   sertraline (ZOLOFT) 100 MG tablet TAKE ONE AND A HALF TABLETS BY MOUTH EVERY DAY 135 tablet 0   sucralfate (CARAFATE) 1 g tablet TAKE 1 TABLET BY MOUTH 4 TIMES DAILY(WITH MEALS AND AT BEDTIME) 240 tablet 4   temazepam (RESTORIL) 30 MG capsule TAKE 1 CAPSULE BY MOUTH AT BEDTIME AS NEEDED 30 capsule 0   Verapamil HCl CR 200 MG CP24 Take 1 capsule (200 mg total) by mouth daily. 30 capsule 5   predniSONE (DELTASONE) 20 MG tablet Take 1 tablet (20 mg total) by mouth daily with breakfast. 7 tablet 0   No facility-administered medications prior to visit.      ROS:  Review of Systems  Constitutional:  Negative for fever.  Gastrointestinal:  Negative for blood in stool, constipation, diarrhea, nausea and vomiting.  Genitourinary:  Negative for dyspareunia, dysuria, flank pain, frequency, hematuria, urgency, vaginal bleeding, vaginal discharge and vaginal pain.  Musculoskeletal:  Negative for back pain.  Skin:  Negative for rash.  BREAST: No symptoms   OBJECTIVE:   Vitals:  BP 136/70    Ht 5\' 4"  (1.626 m)    Wt 182 lb (82.6 kg)    BMI 31.24 kg/m   Physical Exam Vitals reviewed.  Constitutional:      Appearance: She is well-developed.  Pulmonary:     Effort: Pulmonary effort is normal.  Musculoskeletal:        General: Normal range of motion.     Cervical back: Normal range of motion.  Skin:    General: Skin is warm and dry.  Neurological:     General: No focal deficit present.     Mental Status: She is alert and oriented to person, place, and time.     Cranial Nerves: No cranial nerve deficit.  Psychiatric:        Mood and Affect: Mood normal.        Behavior: Behavior normal.        Thought Content: Thought content normal.        Judgment: Judgment normal.    Assessment/Plan: Vasomotor symptoms due to menopause - Plan: progesterone (PROMETRIUM) 100 MG  capsule, estradiol (ESTRACE) 0.5 MG tablet; HRT discussed. Will try estradiol and prometrium. Rx eRxd. Pros/cons discussed. RTO in 3 mos for annual and f/u.   Hormone replacement therapy (HRT) - Plan: progesterone (PROMETRIUM) 100 MG capsule, estradiol (ESTRACE) 0.5 MG tablet  Encounter for screening mammogram for malignant neoplasm of breast - Plan: MM 3D SCREEN BREAST BILATERAL; [facility], call for ref order prn.    Meds ordered this encounter  Medications   progesterone (PROMETRIUM) 100 MG capsule    Sig: Take 1 cap nightly, 6 nights on, 1 night off    Dispense:  90 capsule    Refill:  0    Order Specific Question:   Supervising Provider    Answer:   Nadara Mustard [[id number]]   estradiol (ESTRACE) 0.5 MG tablet    Sig: Take 1 tablet (0.5 mg total) by mouth daily.    Dispense:  90 tablet    Refill:  0    Order Specific Question:   Supervising Provider    Answer:   Nadara Mustard [[id number]]      Return in about 3 months (around 03/07/2022) for annual.  Makenah Karas B. Alfhild Partch, PA-C 12/07/2021 4:08 PM

## 2021-12-28 ENCOUNTER — Telehealth: Payer: Self-pay | Admitting: Family Medicine

## 2021-12-28 DIAGNOSIS — A048 Other specified bacterial intestinal infections: Secondary | ICD-10-CM

## 2021-12-28 DIAGNOSIS — F4323 Adjustment disorder with mixed anxiety and depressed mood: Secondary | ICD-10-CM

## 2021-12-28 DIAGNOSIS — E785 Hyperlipidemia, unspecified: Secondary | ICD-10-CM

## 2022-01-08 ENCOUNTER — Ambulatory Visit (INDEPENDENT_AMBULATORY_CARE_PROVIDER_SITE_OTHER): Payer: BC Managed Care – PPO | Admitting: Physician Assistant

## 2022-01-08 ENCOUNTER — Other Ambulatory Visit: Payer: Self-pay

## 2022-01-08 DIAGNOSIS — B9789 Other viral agents as the cause of diseases classified elsewhere: Secondary | ICD-10-CM | POA: Diagnosis not present

## 2022-01-08 DIAGNOSIS — J019 Acute sinusitis, unspecified: Secondary | ICD-10-CM

## 2022-01-08 NOTE — Progress Notes (Signed)
Established patient visit  I,[name],acting as a scribe for [employer], PA-C.,have documented all relevant documentation on the behalf of [place], PA-C,as directed by  Junie Panning E Kenyotta Dorfman, PA-C while in the presence of Geneva Barrero E Helane Briceno, PA-C.   Patient: Elizabeth Munoz   DOB: 13-May-1966   56 y.o. Female  MRN: [id number] Visit Date: 01/08/2022  Today's healthcare provider: Dani Gobble Myrl Bynum, PA-C   Introduced myself to the patient as a Journalist, newspaper and provided education on APPs in clinical practice.    CC: Sinus pressure and pain   Subjective    Sinus Problem This is a new problem. The current episode started yesterday. The problem is unchanged. There has been no fever. Associated symptoms include congestion and sinus pressure. Pertinent negatives include no chills, coughing, diaphoresis, ear pain, headaches, hoarse voice, neck pain, shortness of breath, sneezing, sore throat or swollen glands. Past treatments include nothing.    Patient has had sinus pressure and pain since yesterday. Also has had a tinge of blood when blowing nose.   States she has had congestion for 5 days and starting yesterday she has had blood when she blows her nose Denies full nose bleed ROS for other pertinent negatives States she tested negative for COVID last Friday  Mucinex at home without relief    Medications: Outpatient Medications Prior to Visit  Medication Sig   ALPRAZolam (XANAX) 0.5 MG tablet TAKE 1 TABLET BY MOUTH ONCE DAILY *NEED TO SCHEDULE OFFICE VISIT FOR FURTHER REFILLS*   butalbital-acetaminophen-caffeine (FIORICET, ESGIC) 50-325-40 MG tablet TAKE ONE TO TWO TABLETS EVERY FOUR HOURSAS NEEDED FOR HEADACHE   cetirizine (ZYRTEC) 10 MG tablet Take 1 tablet (10 mg total) by mouth daily.   dexlansoprazole (DEXILANT) 60 MG capsule Take by mouth.   diclofenac (VOLTAREN) 75 MG EC tablet Take 75 mg by mouth 2 (two) times daily.   estradiol (ESTRACE) 0.5 MG tablet Take 1 tablet (0.5 mg total) by  mouth daily.   fluticasone (FLONASE) 50 MCG/ACT nasal spray TAKE 2 SPRAYS INTO EACH NOSTRIL DAILY   furosemide (LASIX) 20 MG tablet Take by mouth.   gabapentin (NEURONTIN) 600 MG tablet Take 1 tablet (600 mg total) by mouth at bedtime.   meloxicam (MOBIC) 15 MG tablet Take 1 tablet (15 mg total) by mouth daily.   metoCLOPramide (REGLAN) 5 MG tablet TAKE 1 TABLET BY MOUTH 3 TIMES DAILY BEFORE MEALS   Multiple Vitamin (MULTIVITAMIN) tablet Take 1 tablet by mouth daily.   naproxen (NAPROSYN) 375 MG tablet Take 1.5 tablets (562.5 mg total) by mouth 2 (two) times daily as needed.   omeprazole (PRILOSEC) 20 MG capsule TAKE 1 CAPSULE BY MOUTH TWICE DAILY BEFORE MEAL   pantoprazole (PROTONIX) 40 MG tablet TAKE 1 TABLET BY MOUTH TWICE DAILY   polyethylene glycol powder (QC NATURA-LAX) 17 GM/SCOOP powder TAKE 17 GRAMS EVERY DAY   progesterone (PROMETRIUM) 100 MG capsule Take 1 cap nightly, 6 nights on, 1 night off   RABEprazole (ACIPHEX) 20 MG tablet Take 20 mg by mouth 2 (two) times daily.   Rimegepant Sulfate (NURTEC) 75 MG TBDP Take 75 mg by mouth daily.   rizatriptan (MAXALT) 10 MG tablet Take 1 tab at onset of headache, May repeat in 2 hours if needed; max up to 30 mg daily   sertraline (ZOLOFT) 100 MG tablet TAKE ONE AND A HALF TABLETS BY MOUTH EVERY DAY   sucralfate (CARAFATE) 1 g tablet TAKE 1 TABLET BY MOUTH 4 TIMES DAILY(WITH  MEALS AND AT BEDTIME)   temazepam (RESTORIL) 30 MG capsule TAKE 1 CAPSULE BY MOUTH AT BEDTIME AS NEEDED   Verapamil HCl CR 200 MG CP24 Take 1 capsule (200 mg total) by mouth daily.   No facility-administered medications prior to visit.    Review of Systems  Constitutional:  Negative for appetite change, chills, diaphoresis, fatigue and fever.  HENT:  Positive for congestion and sinus pressure. Negative for ear pain, hoarse voice, sinus pain, sneezing and sore throat.        Reports left ear fullness   Eyes:  Negative for visual disturbance.  Respiratory:  Negative  for cough, chest tightness and shortness of breath.   Cardiovascular:  Negative for chest pain and palpitations.  Gastrointestinal:  Negative for abdominal pain, diarrhea, nausea and vomiting.  Musculoskeletal:  Negative for arthralgias, myalgias and neck pain.  Skin:  Negative for rash.  Neurological:  Negative for dizziness, weakness and headaches.      Objective    There were no vitals taken for this visit. {Show previous vital signs (optional):23777}  Physical Exam Vitals reviewed.  Constitutional:      Appearance: Normal appearance.  HENT:     Head: Normocephalic and atraumatic.     Right Ear: Hearing and ear canal normal. A middle ear effusion is present. Tympanic membrane is erythematous and retracted. Tympanic membrane is not bulging.     Left Ear: Hearing and ear canal normal. A middle ear effusion is present. Tympanic membrane is not injected, erythematous, retracted or bulging.     Nose: Septal deviation present.     Right Turbinates: Pale. Not enlarged or swollen.     Left Turbinates: Pale. Not enlarged or swollen.     Mouth/Throat:     Pharynx: Uvula midline. No pharyngeal swelling, oropharyngeal exudate, posterior oropharyngeal erythema or uvula swelling.     Tonsils: No tonsillar exudate or tonsillar abscesses.  Cardiovascular:     Rate and Rhythm: Normal rate and regular rhythm.     Pulses: Normal pulses.     Heart sounds: Normal heart sounds.  Pulmonary:     Effort: Pulmonary effort is normal.     Breath sounds: Normal breath sounds and air entry. No decreased breath sounds, wheezing, rhonchi or rales.  Lymphadenopathy:     Head:     Right side of head: No submental or submandibular adenopathy.     Left side of head: No submental or submandibular adenopathy.     Upper Body:     Right upper body: No supraclavicular adenopathy.     Left upper body: No supraclavicular adenopathy.  Neurological:     Mental Status: She is alert.      No results found for any  visits on 01/08/22.  Assessment & Plan     1. Acute viral sinusitis Visit with patient indicates symptoms comprised of sinus congestion, ear fullness, rhinorrhea for five days congruent with acute viral sinusitis  Due to nature and duration of symptoms recommended treatment regimen is symptomatic relief and follow up if needed Discussed with patient the various viral and bacterial etiologies of current illness and appropriate course of treatment Discussed OTC medication options for multisymptom relief such as Dayquil/Nyquil, Theraflu, AlkaSeltzer, etc. Discussed return precautions if symptoms are not improving or worsen over next 5-7 days.     The entirety of the information documented in the History of Present Illness, Review of Systems and Physical Exam were personally obtained by me. Portions of this information were initially  documented by the CMA and reviewed by me for thoroughness and accuracy.   Dani Gobble Jakel Alphin, PA-C    Almon Register, PA-C  [employer] [phone] (phone) [phone] (fax)  [place]

## 2022-01-08 NOTE — Patient Instructions (Signed)
Based on your described symptoms and the duration of symptoms it is likely that you have a viral upper respiratory infection (often called a "cold")  Symptoms can last for 3-10 days with lingering cough and intermittent symptoms lasting weeks after that.  The goal of treatment at this time is to reduce your symptoms and discomfort   You can use over the counter medications such as Dayquil/Nyquil, AlkaSeltzer formulations, etc to provide further relief of symptoms according to the manufacturer's instructions  If preferred you can use Coricidin to manage your symptoms rather than those medications mentioned above.  Try to rest as much as possible, stay well hydrated to help thin out associated congestion and secretions You can use a humidifier at night and nasal saline spray to help reduce nasal irritation   If your symptoms do not improve or become worse in the next 5-7 days please make an apt at the office so we can see you  Go to the ER if you begin to have more serious symptoms such as shortness of breath, trouble breathing, loss of consciousness, swelling around the eyes, high fever, severe lasting headaches, vision changes or neck pain/stiffness.    It was nice to meet you and I appreciate the opportunity to be involved in your care

## 2022-01-11 ENCOUNTER — Ambulatory Visit: Payer: Self-pay

## 2022-01-11 NOTE — Telephone Encounter (Signed)
Pts husband called in wanting an antibiotic sent in for his wife, caller stated pt just had appt on 1/20 but was not given an antibiotic, pt still has yellow mucus coming out of when she blows her nose and needed advice.    Called husbands mobile number and home number and LM on both to call back to discuss.

## 2022-01-11 NOTE — Telephone Encounter (Signed)
Please Review for Elizabeth Munoz

## 2022-01-11 NOTE — Telephone Encounter (Signed)
°  Chief Complaint: sinus infection Symptoms: sinus pressure, nasal drainage, sore throat, earache Frequency: 2+ weeks Pertinent Negatives: Patient denies fever Disposition: [] ED /[] Urgent Care (no appt availability in office) / [] Appointment(In office/virtual)/ []  [facility]/ [] Home Care/ [] Refused Recommended Disposition /[] [facility]/ [x]  Follow-up with PCP Additional Notes: Pt had OV on 01/08/22 with Erin, PA and wasn't prescribed abx. Pt was advised to take OTC meds and if symptoms continued to f/up. Pt has been taken OTC meds but not improving symptoms. Pt's husband is asking if abx can be called into [facility] pharmacy and he get called back to let him know if something is going to be sent to pharmacy or not. Best call back # [phone].    Summary: advice - sinus issues   Pts husband called in wanting an antibiotic sent in for his wife, caller stated pt just had appt on 1/20 but was not given an antibiotic, pt still has yellow mucus coming out of when she blows her nose and needed advice.       Reason for Disposition  [1] Sinus congestion (pressure, fullness) AND [2] present > 10 days  Answer Assessment - Initial Assessment Questions 1. LOCATION: "Where does it hurt?"      head 2. ONSET: "When did the sinus pain start?"  (e.g., hours, days)      2 weeks 3. SEVERITY: "How bad is the pain?"   (Scale 1-10; mild, moderate or severe)   - MILD (1-3): doesn't interfere with normal activities    - MODERATE (4-7): interferes with normal activities (e.g., work or school) or awakens from sleep   - SEVERE (8-10): excruciating pain and patient unable to do any normal activities        6-7 5. NASAL CONGESTION: "Is the nose blocked?" If Yes, ask: "Can you open it or must you breathe through your mouth?"     yes 6. NASAL DISCHARGE: "Do you have discharge from your nose?" If so ask, "What color?"     yellow 7. FEVER: "Do you have a fever?" If Yes, ask: "What is it, how  was it measured, and when did it start?"      NO 8. OTHER SYMPTOMS: "Do you have any other symptoms?" (e.g., sore throat, cough, earache, difficulty breathing)     Sore throat, earache ( L ear)  Protocols used: Sinus Pain or Congestion-A-AH

## 2022-01-12 ENCOUNTER — Ambulatory Visit: Payer: Self-pay | Admitting: *Deleted

## 2022-01-12 DIAGNOSIS — H6502 Acute serous otitis media, left ear: Secondary | ICD-10-CM

## 2022-01-12 NOTE — Telephone Encounter (Addendum)
Summary: yellow congestion   Reference 01/12/2022 Nurse Triage telephone encounter message. Caller returning a call but chart does not reflect who reached out.    Patient's husband came to the office and said she was getting worse.  Has more yellow congestion, fatigue and feeling worse.     Call and husband visit to office has already been routed to provider- call to office to see if patient needs triage- or wait for provider response.  Per office- please relay provider response: Attempted to call patient- left message to call office. Patient was informed during her visit that her symptoms are most likely viral in etiology and this can take some time 10 +days to resolve. Antibiotics are not effective against viral infections. Are her symptoms not improving or have they gotten worse since her apt? This would include fever, increased sinus drainage, fatigue, etc.

## 2022-01-12 NOTE — Telephone Encounter (Signed)
Patient's husband came to the office and said she was getting worse.  Has more yellow congestion, fatigue and feeling worse.

## 2022-01-12 NOTE — Telephone Encounter (Signed)
Pt's husband stated pt is getting worse with weakness and congestion.  Requesting antibiotics and states this happens annually as well.    Please send into [facility] and contact pt's husband at [phone] when rx is called.

## 2022-01-12 NOTE — Telephone Encounter (Signed)
Pt's husband called, left VM to call to discuss wife's symptoms.

## 2022-01-12 NOTE — Telephone Encounter (Signed)
LMTCB-Ok for [facility] nurse to relay providers message to patient when she calls back.

## 2022-01-12 NOTE — Telephone Encounter (Signed)
3rd attempt, no answer, left VM for pt or husband to call back to speak with a nurse

## 2022-01-13 MED ORDER — AMOXICILLIN 500 MG PO CAPS: 1000.0000 mg | ORAL_CAPSULE | Freq: Two times a day (BID) | ORAL | 0 refills | Status: AC

## 2022-01-13 NOTE — Telephone Encounter (Signed)
Spoke with patient's husband. He is very upset and tried to speak with Production designer, theatre/television/film.Phone called transferred.

## 2022-01-13 NOTE — Addendum Note (Signed)
Addended by: Mila Merry E on: 01/13/2022 11:50 AM   Modules accepted: Orders

## 2022-01-13 NOTE — Telephone Encounter (Signed)
Spoke to patient's husband and reassured him the provider was the one making the decisions regarding antibiotics.  He advised he wanted to speak to the provider making the decisions.  I told him the provider wasn't in the office but I would e happy to set him/patient up with a virtual visit to speak with another provider, especially since the patient wasn't getting any better.  Other test may need to be done as well.  He said he can't speak for his wife on that part and he just wanted the antibiotic like it has been done every year before by Dr. Sullivan Lone.  Stated Dr. Sullivan Lone is his personal friend and it is because Dr. Sullivan Lone isn't here he isn't getting the antibiotic.  He was still very upset and stated he will be looking for another provider.

## 2022-01-13 NOTE — Telephone Encounter (Addendum)
Per Jacquelin Hawking, PA-C  "Patient did not demonstrate signs of bacterial sinusitis on exam on 01/08/2022. Reviewed previous encounters for sinusitis and this was believed to be caused by allergies. This is especially likely considering the information provided by husband that it happens every year. Antibiotics are not recommended for viral or allergic sinusitis. If she is feeling worse and her symptoms have gotten worse we can schedule a follow up to reassess. "  Please see other telephone encounters notes 12/28/2021 about this. I called the husband yesterday and left message at [phone].

## 2022-01-15 DIAGNOSIS — H52221 Regular astigmatism, right eye: Secondary | ICD-10-CM | POA: Diagnosis not present

## 2022-01-15 DIAGNOSIS — H2511 Age-related nuclear cataract, right eye: Secondary | ICD-10-CM | POA: Diagnosis not present

## 2022-02-25 ENCOUNTER — Other Ambulatory Visit: Payer: Self-pay | Admitting: Family Medicine

## 2022-02-25 NOTE — Telephone Encounter (Signed)
Requested medication (s) are due for refill today: yes ? ?Requested medication (s) are on the active medication list: yes ? ?Last refill:  05/13/21 #90/1 ? ?Future visit scheduled: yes ? ?Notes to clinic:  Unable to refill per protocol, cannot delegate. ? ? ? ?  ?Requested Prescriptions  ?Pending Prescriptions Disp Refills  ? metoCLOPramide (REGLAN) 5 MG tablet [Pharmacy Med Name: METOCLOPRAMIDE HCL 5 MG TAB] 90 tablet 1  ?  Sig: TAKE 1 TABLET BY MOUTH 3 TIMES DAILY BEFORE MEALS  ?  ? Not Delegated - Gastroenterology: Antiemetics - metoclopramide Failed - 02/25/2022  8:23 AM  ?  ?  Failed - This refill cannot be delegated  ?  ?  Failed - Cr in normal range and within 360 days  ?  Creatinine, Ser  ?Date Value Ref Range Status  ?09/16/2020 0.66 0.57 - 1.00 mg/dL Final  ?  ?  ?  ?  Passed - Valid encounter within last 6 months  ?  Recent Outpatient Visits   ? ?      ? 1 month ago Acute viral sinusitis  ? [employer], Erin E, PA-C  ? 3 months ago Adjustment disorder with mixed anxiety and depressed mood  ? [facility] Maple Hudson., MD  ? 4 months ago Non-recurrent acute serous otitis media of left ear  ? [facility] Sherrie Mustache, Demetrios Isaacs, MD  ? 1 year ago Screening examination for rubella  ? [facility] Maple Hudson., MD  ? 1 year ago Sinusitis, unspecified chronicity, unspecified location  ? [facility] Maple Hudson., MD  ? ?  ?  ?Future Appointments   ? ?        ? In 3 months Maple Hudson., MD [facility]  ? ?  ? ?  ?  ?  ? ?

## 2022-02-26 DIAGNOSIS — H43812 Vitreous degeneration, left eye: Secondary | ICD-10-CM | POA: Diagnosis not present

## 2022-02-26 DIAGNOSIS — H33311 Horseshoe tear of retina without detachment, right eye: Secondary | ICD-10-CM | POA: Diagnosis not present

## 2022-02-26 DIAGNOSIS — H4312 Vitreous hemorrhage, left eye: Secondary | ICD-10-CM | POA: Diagnosis not present

## 2022-02-26 DIAGNOSIS — H33312 Horseshoe tear of retina without detachment, left eye: Secondary | ICD-10-CM | POA: Diagnosis not present

## 2022-03-08 ENCOUNTER — Ambulatory Visit: Payer: 59 | Admitting: Obstetrics and Gynecology

## 2022-03-11 ENCOUNTER — Other Ambulatory Visit: Payer: Self-pay | Admitting: Family Medicine

## 2022-03-11 DIAGNOSIS — G43119 Migraine with aura, intractable, without status migrainosus: Secondary | ICD-10-CM

## 2022-03-12 NOTE — Telephone Encounter (Signed)
Requested medication (s) are due for refill today: Yes ? ?Requested medication (s) are on the active medication list: Yes ? ?Last refill:  10/12/21 ? ?Future visit scheduled: Yes ? ?Notes to clinic:  Unable to refill per protocol due to failed labs, no updated results.  ? ? ? ?Requested Prescriptions  ?Pending Prescriptions Disp Refills  ? gabapentin (NEURONTIN) 600 MG tablet [Pharmacy Med Name: GABAPENTIN 600 MG TAB] 90 tablet 0  ?  Sig: TAKE 1 TABLET BY MOUTH AT BEDTIME  ?  ? Neurology: Anticonvulsants - gabapentin Failed - 03/11/2022  8:17 AM  ?  ?  Failed - Cr in normal range and within 360 days  ?  Creatinine, Ser  ?Date Value Ref Range Status  ?09/16/2020 0.66 0.57 - 1.00 mg/dL Final  ?  ?  ?  ?  Passed - Completed PHQ-2 or PHQ-9 in the last 360 days  ?  ?  Passed - Valid encounter within last 12 months  ?  Recent Outpatient Visits   ? ?      ? 2 months ago Acute viral sinusitis  ? [employer], Erin E, PA-C  ? 4 months ago Adjustment disorder with mixed anxiety and depressed mood  ? [facility] Maple Hudson., MD  ? 5 months ago Non-recurrent acute serous otitis media of left ear  ? [facility] Sherrie Mustache, Demetrios Isaacs, MD  ? 1 year ago Screening examination for rubella  ? [facility] Maple Hudson., MD  ? 1 year ago Sinusitis, unspecified chronicity, unspecified location  ? [facility] Maple Hudson., MD  ? ?  ?  ?Future Appointments   ? ?        ? In 2 months Maple Hudson., MD [facility]  ? ?  ? ?  ?  ?  ? ? ? ? ?

## 2022-03-15 ENCOUNTER — Other Ambulatory Visit: Payer: Self-pay | Admitting: Obstetrics and Gynecology

## 2022-03-15 DIAGNOSIS — N951 Menopausal and female climacteric states: Secondary | ICD-10-CM

## 2022-03-15 DIAGNOSIS — Z79890 Hormone replacement therapy (postmenopausal): Secondary | ICD-10-CM

## 2022-03-22 ENCOUNTER — Telehealth: Payer: Self-pay

## 2022-03-22 ENCOUNTER — Other Ambulatory Visit: Payer: Self-pay | Admitting: Obstetrics and Gynecology

## 2022-03-22 DIAGNOSIS — Z79890 Hormone replacement therapy (postmenopausal): Secondary | ICD-10-CM

## 2022-03-22 DIAGNOSIS — N951 Menopausal and female climacteric states: Secondary | ICD-10-CM

## 2022-03-22 MED ORDER — PROGESTERONE MICRONIZED 100 MG PO CAPS: ORAL_CAPSULE | ORAL | 0 refills | Status: DC

## 2022-03-22 MED ORDER — ESTRADIOL 0.5 MG PO TABS: 0.5000 mg | ORAL_TABLET | Freq: Every day | ORAL | 0 refills | Status: DC

## 2022-03-22 NOTE — Progress Notes (Signed)
Rx Rf HRT. Annual 4/23 ?

## 2022-03-22 NOTE — Telephone Encounter (Signed)
Rx RF eRxd.  

## 2022-03-22 NOTE — Telephone Encounter (Signed)
Pt calling for refill of her hot flash medicine; has been out since Monday of last week; has had a headache since Friday; has appt scheduled for 03/30/22.  [phone] ?

## 2022-03-23 NOTE — Telephone Encounter (Signed)
Pt aware and appreciative  

## 2022-03-30 ENCOUNTER — Encounter: Payer: Self-pay | Admitting: Obstetrics and Gynecology

## 2022-03-30 ENCOUNTER — Ambulatory Visit (INDEPENDENT_AMBULATORY_CARE_PROVIDER_SITE_OTHER): Payer: 59 | Admitting: Obstetrics and Gynecology

## 2022-03-30 DIAGNOSIS — Z79890 Hormone replacement therapy (postmenopausal): Secondary | ICD-10-CM | POA: Diagnosis not present

## 2022-03-30 DIAGNOSIS — N951 Menopausal and female climacteric states: Secondary | ICD-10-CM | POA: Diagnosis not present

## 2022-03-30 MED ORDER — PROGESTERONE MICRONIZED 100 MG PO CAPS: ORAL_CAPSULE | ORAL | 0 refills | Status: DC

## 2022-03-30 NOTE — Progress Notes (Signed)
? ? ?Maple Hudson., MD ? ? ?Chief Complaint  ?Patient presents with  ? Follow-up  ?  Hot flashes, feeling a little better since last visit  ? ? ?HPI: ?     [name] is a 56 y.o. [id number] whose LMP was No LMP recorded. Patient is perimenopausal., presents today for HRT/vasomotor sx f/u. Started on estradiol 0.5 mg and prometrium 100 mg QHS 12/22. Sx significantly improved. No PMB, no side effects. Sx were hourly hot flashes and night sweats affecting sleep for close to a yr prior to HRT. Already on gabapentin and zoloft without sx relief. LMP ~1/22, no BTB since. Neg pap/neg HPV DNA 6/19; neg mammo at [facility] 6/20. Due for annual but pt has to reschedule due to fam emergency today. Needs Rx RF till she can do annual (ERT RF already sent last wk).  ? ?Patient Active Problem List  ? Diagnosis Date Noted  ? Vasomotor symptoms due to menopause 05/23/2019  ? Perimenopause 05/29/2018  ? Adaptation reaction 10/13/2016  ? H/O pyloric stenosis   ? Menometrorrhagia 06/22/2015  ? PMS (premenstrual syndrome) 06/22/2015  ? Mood disorder (HCC) 06/22/2015  ? Migraine 06/19/2015  ? GERD (gastroesophageal reflux disease)   ? ? ?Past Surgical History:  ?Procedure Laterality Date  ? BREAST REDUCTION SURGERY    ? CESAREAN SECTION    ? x 2  ? TUBAL LIGATION    ? UPPER GI ENDOSCOPY    ? chronic gastritis, LA esophagitis  ? ? ?Family History  ?Problem Relation Age of Onset  ? Alzheimer's disease Mother   ? Diabetes Father   ? Prostate cancer Father   ? Diabetes Brother   ? ? ?Social History  ? ?Socioeconomic History  ? Marital status: Single  ?  Spouse name: Not on file  ? Number of children: Not on file  ? Years of education: Not on file  ? Highest education level: Not on file  ?Occupational History  ? Not on file  ?Tobacco Use  ? Smoking status: Never  ? Smokeless tobacco: Never  ?Substance and Sexual Activity  ? Alcohol use: No  ?  Alcohol/week: 0.0 standard drinks  ? Drug use: No  ? Sexual activity: Not Currently   ?  Birth control/protection: Surgical  ?Other Topics Concern  ? Not on file  ?Social History Narrative  ? Not on file  ? ?Social Determinants of Health  ? ?Financial Resource Strain: Not on file  ?Food Insecurity: Not on file  ?Transportation Needs: Not on file  ?Physical Activity: Not on file  ?Stress: Not on file  ?Social Connections: Not on file  ?Intimate Partner Violence: Not on file  ? ? ?Outpatient Medications Prior to Visit  ?Medication Sig Dispense Refill  ? ALPRAZolam (XANAX) 0.5 MG tablet TAKE 1 TABLET BY MOUTH ONCE DAILY *NEED TO SCHEDULE OFFICE VISIT FOR FURTHER REFILLS* 30 tablet 4  ? butalbital-acetaminophen-caffeine (FIORICET, ESGIC) 50-325-40 MG tablet TAKE ONE TO TWO TABLETS EVERY FOUR HOURSAS NEEDED FOR HEADACHE 35 tablet 3  ? cetirizine (ZYRTEC) 10 MG tablet Take 1 tablet (10 mg total) by mouth daily. 30 tablet 11  ? dexlansoprazole (DEXILANT) 60 MG capsule Take by mouth.    ? diclofenac (VOLTAREN) 75 MG EC tablet Take 75 mg by mouth 2 (two) times daily.    ? estradiol (ESTRACE) 0.5 MG tablet Take 1 tablet (0.5 mg total) by mouth daily. 90 tablet 0  ? fluticasone (FLONASE) 50 MCG/ACT nasal spray TAKE 2 SPRAYS INTO  EACH NOSTRIL DAILY 16 g 11  ? furosemide (LASIX) 20 MG tablet Take by mouth.    ? gabapentin (NEURONTIN) 600 MG tablet TAKE 1 TABLET BY MOUTH AT BEDTIME 90 tablet 0  ? meloxicam (MOBIC) 15 MG tablet Take 1 tablet (15 mg total) by mouth daily. 30 tablet 0  ? metoCLOPramide (REGLAN) 5 MG tablet TAKE 1 TABLET BY MOUTH 3 TIMES DAILY BEFORE MEALS 90 tablet 1  ? Multiple Vitamin (MULTIVITAMIN) tablet Take 1 tablet by mouth daily.    ? naproxen (NAPROSYN) 375 MG tablet Take 1.5 tablets (562.5 mg total) by mouth 2 (two) times daily as needed. 60 tablet 2  ? omeprazole (PRILOSEC) 20 MG capsule TAKE 1 CAPSULE BY MOUTH TWICE DAILY BEFORE MEAL 60 capsule 1  ? pantoprazole (PROTONIX) 40 MG tablet TAKE 1 TABLET BY MOUTH TWICE DAILY 60 tablet 11  ? polyethylene glycol powder (QC NATURA-LAX) 17  GM/SCOOP powder TAKE 17 GRAMS EVERY DAY 510 g 6  ? RABEprazole (ACIPHEX) 20 MG tablet Take 20 mg by mouth 2 (two) times daily.    ? Rimegepant Sulfate (NURTEC) 75 MG TBDP Take 75 mg by mouth daily. 8 tablet 5  ? rizatriptan (MAXALT) 10 MG tablet Take 1 tab at onset of headache, May repeat in 2 hours if needed; max up to 30 mg daily 10 tablet 0  ? sertraline (ZOLOFT) 100 MG tablet TAKE ONE AND A HALF TABLETS BY MOUTH EVERY DAY 135 tablet 0  ? sucralfate (CARAFATE) 1 g tablet TAKE 1 TABLET BY MOUTH 4 TIMES DAILY(WITH MEALS AND AT BEDTIME) 240 tablet 4  ? temazepam (RESTORIL) 30 MG capsule TAKE 1 CAPSULE BY MOUTH AT BEDTIME AS NEEDED 30 capsule 0  ? Verapamil HCl CR 200 MG CP24 Take 1 capsule (200 mg total) by mouth daily. 30 capsule 5  ? progesterone (PROMETRIUM) 100 MG capsule Take 1 cap nightly, 6 nights on, 1 night off 90 capsule 0  ? ?No facility-administered medications prior to visit.  ? ? ? ? ?ROS: ? ?Review of Systems  ?Constitutional:  Negative for fever.  ?Gastrointestinal:  Negative for blood in stool, constipation, diarrhea, nausea and vomiting.  ?Genitourinary:  Negative for dyspareunia, dysuria, flank pain, frequency, hematuria, urgency, vaginal bleeding, vaginal discharge and vaginal pain.  ?Musculoskeletal:  Negative for back pain.  ?Skin:  Negative for rash.  ?Psychiatric/Behavioral:  Positive for agitation and dysphoric mood.   ?BREAST: No symptoms ? ? ?OBJECTIVE:  ? ?Vitals:  ?BP 112/80   Ht 5\' 4"  (1.626 m)   Wt 183 lb (83 kg)   BMI 31.41 kg/m?  ? ?Physical Exam ?Constitutional:   ?   Appearance: Normal appearance.  ?Pulmonary:  ?   Effort: Pulmonary effort is normal.  ?Musculoskeletal:     ?   General: Normal range of motion.  ?Neurological:  ?   Mental Status: She is alert and oriented to person, place, and time.  ?Psychiatric:     ?   Judgment: Judgment normal.  ? ?Assessment/Plan: ?Vasomotor symptoms due to menopause - Plan: progesterone (PROMETRIUM) 100 MG capsule; sx improved with HRT. Rx  RF prometrium (already has Rx RF estradiol) till can schedule annual 6/23.  ? ?Hormone replacement therapy (HRT) - Plan: progesterone (PROMETRIUM) 100 MG capsule ? ? ?Meds ordered this encounter  ?Medications  ? progesterone (PROMETRIUM) 100 MG capsule  ?  Sig: Take 1 cap nightly, 6 nights on, 1 night off  ?  Dispense:  90 capsule  ?  Refill:  0  ?  Order Specific Question:   Supervising Provider  ?  AnswerNadara Mustard [[id number]]  ? ? ? Return if symptoms worsen or fail to improve. ? ?Osmel Dykstra B. Tynisa Vohs, PA-C ?03/30/2022 ?11:34 AM ? ? ? ? ? ?

## 2022-03-31 ENCOUNTER — Telehealth: Payer: Self-pay

## 2022-03-31 NOTE — Telephone Encounter (Signed)
Copied from CRM [id number]. Topic: General - Other ?>> Mar 31, 2022 11:22 AM Jaquita Rector A wrote: ?Reason for CRM: Patient asking for Dr Sullivan Lone nurse to call her back. Patient refused to give any information as to what this was about just that its important to call her back. Can be reached at Ph# [phone] ?

## 2022-03-31 NOTE — Telephone Encounter (Signed)
Returned call to patient.

## 2022-04-02 ENCOUNTER — Other Ambulatory Visit: Payer: Self-pay | Admitting: Family Medicine

## 2022-04-02 NOTE — Telephone Encounter (Signed)
Pt returning call requesting a call back.  ? ? [phone] ?

## 2022-04-02 NOTE — Telephone Encounter (Signed)
Returned call to patient.

## 2022-04-02 NOTE — Telephone Encounter (Signed)
LMOVM for pt to return call 

## 2022-04-05 ENCOUNTER — Ambulatory Visit: Payer: Self-pay | Admitting: Dermatology

## 2022-04-22 ENCOUNTER — Ambulatory Visit: Payer: Self-pay | Admitting: Dermatology

## 2022-05-20 ENCOUNTER — Ambulatory Visit: Payer: Self-pay | Admitting: Dermatology

## 2022-05-24 ENCOUNTER — Other Ambulatory Visit: Payer: Self-pay | Admitting: Family Medicine

## 2022-05-24 DIAGNOSIS — G43119 Migraine with aura, intractable, without status migrainosus: Secondary | ICD-10-CM

## 2022-05-26 ENCOUNTER — Other Ambulatory Visit: Payer: Self-pay | Admitting: Obstetrics and Gynecology

## 2022-05-26 ENCOUNTER — Other Ambulatory Visit: Payer: Self-pay | Admitting: Family Medicine

## 2022-05-26 DIAGNOSIS — N951 Menopausal and female climacteric states: Secondary | ICD-10-CM

## 2022-05-26 DIAGNOSIS — F4323 Adjustment disorder with mixed anxiety and depressed mood: Secondary | ICD-10-CM

## 2022-05-26 DIAGNOSIS — E785 Hyperlipidemia, unspecified: Secondary | ICD-10-CM

## 2022-05-26 DIAGNOSIS — Z79890 Hormone replacement therapy (postmenopausal): Secondary | ICD-10-CM

## 2022-05-26 NOTE — Telephone Encounter (Signed)
Requested Prescriptions  Pending Prescriptions Disp Refills  . sertraline (ZOLOFT) 100 MG tablet [Pharmacy Med Name: SERTRALINE HCL 100 MG TAB] 135 tablet 0    Sig: TAKE ONE AND A HALF TABLETS BY MOUTH EVERY DAY     Psychiatry:  Antidepressants - SSRI - sertraline Failed - 05/26/2022  1:43 PM      Failed - AST in normal range and within 360 days    AST  Date Value Ref Range Status  09/16/2020 18 0 - 40 IU/L Final         Failed - ALT in normal range and within 360 days    ALT  Date Value Ref Range Status  09/16/2020 16 0 - 32 IU/L Final         Passed - Completed PHQ-2 or PHQ-9 in the last 360 days      Passed - Valid encounter within last 6 months    Recent Outpatient Visits          4 months ago Acute viral sinusitis   [facility], Erin E, PA-C   6 months ago Adjustment disorder with mixed anxiety and depressed mood   [facility] Jerrol Banana., MD   7 months ago Non-recurrent acute serous otitis media of left ear   [facility] Birdie Sons, MD   1 year ago Screening examination for rubella   [facility] Jerrol Banana., MD   1 year ago Sinusitis, unspecified chronicity, unspecified location   [facility] Jerrol Banana., MD      Future Appointments            In 1 week Jerrol Banana., MD [facility] [place]

## 2022-06-02 NOTE — Progress Notes (Signed)
PCP: Maple Hudson., MD   Chief Complaint  Patient presents with   Gynecologic Exam    Has qs on pills for hot flashes (weight gain concern)    HPI:      Elizabeth Munoz is a 56 y.o. [id number] whose LMP was No LMP recorded. Patient is perimenopausal., presents today for her annual. Her menses are absent due to menopause, no PMB. Started on estradiol 0.5 mg and prometrium 100 mg QHS 12/22. Sx significantly improved.  Sx were hourly hot flashes and night sweats affecting sleep for close to a yr prior to HRT. Already on gabapentin and zoloft without sx relief. LMP ~1/22, no BTB since. Wants to cont HRT. Only gained 1# from 12/22 visit here.    Sex activity: not currently sexually active. Having issues with vaginal itching in 1 spot externally recently. Treated with hydrocortisone crm with some improvement; wants checked. Uses dove sens skin soap, no dryer sheets, wears pantyliners daily. No increased vag d/c, odor.   Last Pap: 05/29/18  Results were: no abnormalities /neg HPV DNA.  Hx of STDs: none  Last mammogram: 06/11/19 at [facility]: Results were: normal--routine follow-up in 12 months There is no FH of breast cancer. There is no FH of ovarian cancer. The patient does do self-breast exams.  Colonoscopy: not done; pt wanted to wait till 53  Tobacco use: The patient denies current or previous tobacco use. Alcohol use: none No drug use Exercise: not active  She does get adequate calcium and Vitamin D in her diet.  Labs with PCP.  Past Medical History:  Diagnosis Date   Depression    GERD (gastroesophageal reflux disease)    H/O pyloric stenosis    06/24/2015 noted also by Dr. Sullivan Lone   Insomnia    Migraine 06/19/2015    Past Surgical History:  Procedure Laterality Date   BREAST REDUCTION SURGERY     CESAREAN SECTION     x 2   TUBAL LIGATION     UPPER GI ENDOSCOPY     chronic gastritis, LA esophagitis    Family History  Problem Relation Age of Onset    Alzheimer's disease Mother    Diabetes Father    Prostate cancer Father    Diabetes Brother     Social History   Socioeconomic History   Marital status: Single    Spouse name: Not on file   Number of children: Not on file   Years of education: Not on file   Highest education level: Not on file  Occupational History   Not on file  Tobacco Use   Smoking status: Never   Smokeless tobacco: Never  Substance and Sexual Activity   Alcohol use: No    Alcohol/week: 0.0 standard drinks of alcohol   Drug use: No   Sexual activity: Not Currently    Birth control/protection: Surgical  Other Topics Concern   Not on file  Social History Narrative   Not on file   Social Determinants of Health   Financial Resource Strain: Not on file  Food Insecurity: Not on file  Transportation Needs: Not on file  Physical Activity: Not on file  Stress: Not on file  Social Connections: Not on file  Intimate Partner Violence: Not on file    Outpatient Medications Prior to Visit  Medication Sig Dispense Refill   ALPRAZolam (XANAX) 0.5 MG tablet TAKE 1 TABLET BY MOUTH ONCE DAILY *NEED TO SCHEDULE OFFICE VISIT FOR FURTHER REFILLS* 30 tablet 4  butalbital-acetaminophen-caffeine (FIORICET, ESGIC) 50-325-40 MG tablet TAKE ONE TO TWO TABLETS EVERY FOUR HOURSAS NEEDED FOR HEADACHE 35 tablet 3   celecoxib (CELEBREX) 200 MG capsule Take 1 capsule (200 mg total) by mouth daily. 20 capsule 0   cetirizine (ZYRTEC) 10 MG tablet Take 1 tablet (10 mg total) by mouth daily. 30 tablet 11   dexlansoprazole (DEXILANT) 60 MG capsule Take by mouth.     diclofenac (VOLTAREN) 75 MG EC tablet Take 75 mg by mouth 2 (two) times daily.     fluticasone (FLONASE) 50 MCG/ACT nasal spray TAKE 2 SPRAYS INTO EACH NOSTRIL DAILY 16 g 11   furosemide (LASIX) 20 MG tablet Take by mouth.     gabapentin (NEURONTIN) 600 MG tablet Take 1 tablet (600 mg total) by mouth at bedtime. 90 tablet 0   meloxicam (MOBIC) 15 MG tablet Take 1 tablet  (15 mg total) by mouth daily. 30 tablet 0   metoCLOPramide (REGLAN) 5 MG tablet TAKE 1 TABLET BY MOUTH 3 TIMES DAILY BEFORE MEALS 90 tablet 1   Multiple Vitamin (MULTIVITAMIN) tablet Take 1 tablet by mouth daily.     naproxen (NAPROSYN) 375 MG tablet Take 1.5 tablets (562.5 mg total) by mouth 2 (two) times daily as needed. 60 tablet 2   omeprazole (PRILOSEC) 20 MG capsule TAKE 1 CAPSULE BY MOUTH TWICE DAILY BEFORE MEAL 60 capsule 1   pantoprazole (PROTONIX) 40 MG tablet TAKE 1 TABLET BY MOUTH TWICE DAILY 60 tablet 11   polyethylene glycol powder (QC NATURA-LAX) 17 GM/SCOOP powder TAKE 17 GRAMS EVERY DAY 510 g 6   RABEprazole (ACIPHEX) 20 MG tablet Take 20 mg by mouth 2 (two) times daily.     Rimegepant Sulfate (NURTEC) 75 MG TBDP Take 75 mg by mouth daily. 8 tablet 5   rizatriptan (MAXALT) 10 MG tablet Take 1 tab at onset of headache, May repeat in 2 hours if needed; max up to 30 mg daily 10 tablet 0   Sertraline HCl 200 MG CAPS Take 200 mg by mouth daily. 90 capsule 1   sucralfate (CARAFATE) 1 g tablet TAKE 1 TABLET BY MOUTH 4 TIMES DAILY(WITH MEALS AND AT BEDTIME) 240 tablet 4   temazepam (RESTORIL) 30 MG capsule Take 1 capsule (30 mg total) by mouth at bedtime as needed. 30 capsule 0   Verapamil HCl CR 200 MG CP24 TAKE 1 CAPSULE BY MOUTH ONCE DAILY 30 capsule 2   estradiol (ESTRACE) 0.5 MG tablet Take 1 tablet (0.5 mg total) by mouth daily. 90 tablet 0   progesterone (PROMETRIUM) 100 MG capsule Take 1 cap nightly, 6 nights on, 1 night off 90 capsule 0   No facility-administered medications prior to visit.     ROS:  Review of Systems  Constitutional:  Negative for fatigue, fever and unexpected weight change.  Respiratory:  Negative for cough, shortness of breath and wheezing.   Cardiovascular:  Negative for chest pain, palpitations and leg swelling.  Gastrointestinal:  Negative for blood in stool, constipation, diarrhea, nausea and vomiting.  Endocrine: Negative for cold intolerance,  heat intolerance and polyuria.  Genitourinary:  Negative for dyspareunia, dysuria, flank pain, frequency, genital sores, hematuria, menstrual problem, pelvic pain, urgency, vaginal bleeding, vaginal discharge and vaginal pain.  Musculoskeletal:  Negative for back pain, joint swelling and myalgias.  Skin:  Negative for rash.  Neurological:  Negative for dizziness, syncope, light-headedness, numbness and headaches.  Hematological:  Negative for adenopathy.  Psychiatric/Behavioral:  Positive for dysphoric mood. Negative for agitation, confusion, sleep disturbance  and suicidal ideas. The patient is not nervous/anxious.   BREAST: No symptoms   Objective: BP 118/70   Ht 5\' 4"  (1.626 m)   Wt 183 lb (83 kg)   BMI 31.41 kg/m    Physical Exam Constitutional:      Appearance: She is well-developed.  Genitourinary:     Vulva normal.     Right Labia: No rash, tenderness or lesions.    Left Labia: No tenderness, lesions or rash.       No vaginal discharge, erythema or tenderness.      Right Adnexa: not tender and no mass present.    Left Adnexa: not tender and no mass present.    No cervical motion tenderness, friability or polyp.     Uterus is not enlarged or tender.  Breasts:    Right: No mass, nipple discharge, skin change or tenderness.     Left: No mass, nipple discharge, skin change or tenderness.  Neck:     Thyroid: No thyromegaly.  Cardiovascular:     Rate and Rhythm: Normal rate and regular rhythm.     Heart sounds: Normal heart sounds. No murmur heard. Pulmonary:     Effort: Pulmonary effort is normal.     Breath sounds: Normal breath sounds.  Abdominal:     Palpations: Abdomen is soft.     Tenderness: There is no abdominal tenderness. There is no guarding or rebound.  Musculoskeletal:        General: Normal range of motion.     Cervical back: Normal range of motion.  Lymphadenopathy:     Cervical: No cervical adenopathy.  Neurological:     General: No focal deficit  present.     Mental Status: She is alert and oriented to person, place, and time.     Cranial Nerves: No cranial nerve deficit.  Skin:    General: Skin is warm and dry.  Psychiatric:        Mood and Affect: Mood normal.        Behavior: Behavior normal.        Thought Content: Thought content normal.        Judgment: Judgment normal.  Vitals reviewed.    Results for orders placed or performed in visit on 06/03/22 (from the past 24 hour(s))  POCT Wet Prep with KOH     Status: Normal   Collection Time: 06/03/22 11:42 AM  Result Value Ref Range   Trichomonas, UA Negative    Clue Cells Wet Prep HPF POC neg    Epithelial Wet Prep HPF POC     Yeast Wet Prep HPF POC neg    Bacteria Wet Prep HPF POC     RBC Wet Prep HPF POC     WBC Wet Prep HPF POC     KOH Prep POC Negative Negative    Assessment/Plan:  Encounter for annual routine gynecological examination  Cervical cancer screening - Plan: Cytology - PAP  Screening for HPV (human papillomavirus) - Plan: Cytology - PAP  Encounter for screening mammogram for malignant neoplasm of breast - Plan: MM 3D SCREEN BREAST BILATERAL; pt to schedule mammo at [facility]  Screening for colon cancer - Plan: Cologuard; colonoscopy/cologuard discussed. Pt elects cologuard. Ref sent. Will f/u with results.  Vasomotor symptoms due to menopause - Plan: progesterone (PROMETRIUM) 100 MG capsule, estradiol (ESTRACE) 0.5 MG tablet; sx improved, Rx RF. F/u prn.   Hormone replacement therapy (HRT) - Plan: progesterone (PROMETRIUM) 100 MG capsule, estradiol (ESTRACE) 0.5 MG  tablet  Vaginal itching - Plan: clotrimazole-betamethasone (LOTRISONE) cream, POCT Wet Prep with KOH; neg exam/neg wet prep; Rx lotrisone crm prn sx. Change pantyliners regularly to keep dry. F/u prn.   Meds ordered this encounter  Medications   progesterone (PROMETRIUM) 100 MG capsule    Sig: Take 1 cap nightly, 6 nights on, 1 night off    Dispense:  90 capsule    Refill:  3    Order  Specific Question:   Supervising Provider    Answer:   CONSTANT, PEGGY [[id number]]   estradiol (ESTRACE) 0.5 MG tablet    Sig: Take 1 tablet (0.5 mg total) by mouth daily.    Dispense:  90 tablet    Refill:  3    Order Specific Question:   Supervising Provider    Answer:   CONSTANT, PEGGY [[id number]]   clotrimazole-betamethasone (LOTRISONE) cream    Sig: Apply externally BID prn sx up to 2 wks    Dispense:  15 g    Refill:  0    Order Specific Question:   Supervising Provider    Answer:   CONSTANT, PEGGY [[id number]]          GYN counsel breast self exam, mammography screening, menopause, adequate intake of calcium and vitamin D, diet and exercise    F/U  Return in about 1 year (around 06/04/2023).  Omari Mcmanaway B. Annastyn Silvey, PA-C 06/03/2022 11:43 AM

## 2022-06-02 NOTE — Progress Notes (Unsigned)
Complete physical exam  I,[name],acting as a scribe for Wilhemena Durie, MD.,have documented all relevant documentation on the behalf of Wilhemena Durie, MD,as directed by  Wilhemena Durie, MD while in the presence of Wilhemena Durie, MD.                                                                                                                 Patient: Elizabeth Munoz   DOB: 03-18-1966   56 y.o. Female  MRN: [id number] Visit Date: 06/03/2022  Today's healthcare provider: Wilhemena Durie, MD   Chief Complaint  Patient presents with   Annual Exam   Subjective    Elizabeth Munoz is a 56 y.o. female who presents today for a complete physical exam.  She reports consuming a general diet. The patient does not participate in regular exercise at present. She generally feels fairly well. She reports sleeping poorly. She does not have additional problems to discuss today.  HPI    Past Medical History:  Diagnosis Date   Depression    GERD (gastroesophageal reflux disease)    H/O pyloric stenosis    06/24/2015 noted also by Dr. Rosanna Randy   Insomnia    Migraine 06/19/2015   Past Surgical History:  Procedure Laterality Date   BREAST REDUCTION SURGERY     CESAREAN SECTION     x 2   TUBAL LIGATION     UPPER GI ENDOSCOPY     chronic gastritis, LA esophagitis   Social History   Socioeconomic History   Marital status: Single    Spouse name: Not on file   Number of children: Not on file   Years of education: Not on file   Highest education level: Not on file  Occupational History   Not on file  Tobacco Use   Smoking status: Never   Smokeless tobacco: Never  Substance and Sexual Activity   Alcohol use: No    Alcohol/week: 0.0 standard drinks of alcohol   Drug use: No   Sexual activity: Not Currently    Birth control/protection: Surgical  Other Topics Concern   Not on file  Social History Narrative   Not on file   Social Determinants of  Health   Financial Resource Strain: Not on file  Food Insecurity: Not on file  Transportation Needs: Not on file  Physical Activity: Not on file  Stress: Not on file  Social Connections: Not on file  Intimate Partner Violence: Not on file   Family Status  Relation Name Status   Mother  Alive   Father  Alive   Brother  Deceased   Family History  Problem Relation Age of Onset   Alzheimer's disease Mother    Diabetes Father    Prostate cancer Father    Diabetes Brother    Allergies  Allergen Reactions   Imitrex [Sumatriptan]     Stomach pain, nausea, vomiting, felt bad    Patient Care Team: Jerrol Banana., MD as PCP -  General (Family Medicine)   Medications: Outpatient Medications Prior to Visit  Medication Sig   butalbital-acetaminophen-caffeine (FIORICET, ESGIC) 50-325-40 MG tablet TAKE ONE TO TWO TABLETS EVERY FOUR HOURSAS NEEDED FOR HEADACHE   dexlansoprazole (DEXILANT) 60 MG capsule Take by mouth.   diclofenac (VOLTAREN) 75 MG EC tablet Take 75 mg by mouth 2 (two) times daily.   estradiol (ESTRACE) 0.5 MG tablet Take 1 tablet (0.5 mg total) by mouth daily.   fluticasone (FLONASE) 50 MCG/ACT nasal spray TAKE 2 SPRAYS INTO EACH NOSTRIL DAILY   furosemide (LASIX) 20 MG tablet Take by mouth.   meloxicam (MOBIC) 15 MG tablet Take 1 tablet (15 mg total) by mouth daily.   metoCLOPramide (REGLAN) 5 MG tablet TAKE 1 TABLET BY MOUTH 3 TIMES DAILY BEFORE MEALS   Multiple Vitamin (MULTIVITAMIN) tablet Take 1 tablet by mouth daily.   naproxen (NAPROSYN) 375 MG tablet Take 1.5 tablets (562.5 mg total) by mouth 2 (two) times daily as needed.   omeprazole (PRILOSEC) 20 MG capsule TAKE 1 CAPSULE BY MOUTH TWICE DAILY BEFORE MEAL   pantoprazole (PROTONIX) 40 MG tablet TAKE 1 TABLET BY MOUTH TWICE DAILY   polyethylene glycol powder (QC NATURA-LAX) 17 GM/SCOOP powder TAKE 17 GRAMS EVERY DAY   progesterone (PROMETRIUM) 100 MG capsule Take 1 cap nightly, 6 nights on, 1 night off    RABEprazole (ACIPHEX) 20 MG tablet Take 20 mg by mouth 2 (two) times daily.   Rimegepant Sulfate (NURTEC) 75 MG TBDP Take 75 mg by mouth daily.   rizatriptan (MAXALT) 10 MG tablet Take 1 tab at onset of headache, May repeat in 2 hours if needed; max up to 30 mg daily   sucralfate (CARAFATE) 1 g tablet TAKE 1 TABLET BY MOUTH 4 TIMES DAILY(WITH MEALS AND AT BEDTIME)   Verapamil HCl CR 200 MG CP24 TAKE 1 CAPSULE BY MOUTH ONCE DAILY   [DISCONTINUED] ALPRAZolam (XANAX) 0.5 MG tablet TAKE 1 TABLET BY MOUTH ONCE DAILY *NEED TO SCHEDULE OFFICE VISIT FOR FURTHER REFILLS*   [DISCONTINUED] cetirizine (ZYRTEC) 10 MG tablet Take 1 tablet (10 mg total) by mouth daily.   [DISCONTINUED] gabapentin (NEURONTIN) 600 MG tablet TAKE 1 TABLET BY MOUTH AT BEDTIME   [DISCONTINUED] sertraline (ZOLOFT) 100 MG tablet TAKE ONE AND A HALF TABLETS BY MOUTH EVERY DAY   [DISCONTINUED] temazepam (RESTORIL) 30 MG capsule TAKE 1 CAPSULE BY MOUTH AT BEDTIME AS NEEDED   No facility-administered medications prior to visit.    Review of Systems  HENT:  Positive for sore throat.   Musculoskeletal:  Positive for arthralgias.  All other systems reviewed and are negative.   {Labs  Heme  Chem  Endocrine  Serology  Results Review (optional):23779}  Objective     There were no vitals taken for this visit. {Show previous vital signs (optional):23777}   Physical Exam  ***  Last depression screening scores    06/03/2022    9:16 AM 01/08/2022    4:04 PM 09/16/2020    4:59 PM  PHQ 2/9 Scores  PHQ - 2 Score 3 0 6  PHQ- 9 Score '14 3 20   ' Last fall risk screening    06/03/2022    9:16 AM  [place] in the past year? 0  Number falls in past yr: 0  Injury with Fall? 0  Risk for fall due to : No Fall Risks  Follow up Falls evaluation completed   Last Audit-C alcohol use screening    06/03/2022  9:16 AM  Alcohol Use Disorder Test (AUDIT)  1. How often do you have a drink containing alcohol? 0  2. How many  drinks containing alcohol do you have on a typical day when you are drinking? 0  3. How often do you have six or more drinks on one occasion? 0  AUDIT-C Score 0   A score of 3 or more in women, and 4 or more in men indicates increased risk for alcohol abuse, EXCEPT if all of the points are from question 1   No results found for any visits on 06/03/22.  Assessment & Plan    Routine Health Maintenance and Physical Exam  Exercise Activities and Dietary recommendations  Goals   None     Immunization History  Administered Date(s) Administered   Influenza,inj,Quad PF,6+ Mos 12/06/2016, 10/19/2018, 10/24/2019, 09/24/2020   MMR 10/02/2020   PFIZER(Purple Top)SARS-COV-2 Vaccination 02/15/2020, 03/07/2020   Tdap 09/24/2020    Health Maintenance  Topic Date Due   HIV Screening  Never done   Hepatitis C Screening  Never done   COLONOSCOPY (Pts 45-5yr Insurance coverage will need to be confirmed)  Never done   Zoster Vaccines- Shingrix (1 of 2) Never done   COVID-19 Vaccine (3 - Pfizer series) 05/02/2020   MAMMOGRAM  06/10/2021   INFLUENZA VACCINE  07/20/2022   PAP SMEAR-Modifier  05/30/2023   TETANUS/TDAP  09/24/2030   HPV VACCINES  Aged Out    Discussed health benefits of physical activity, and encouraged her to engage in regular exercise appropriate for her age and condition.  1. Annual physical exam  - Lipid panel - TSH - CBC w/Diff/Platelet - Comprehensive Metabolic Panel (CMET)  2. Hyperlipidemia, unspecified hyperlipidemia type  - Lipid panel - TSH - CBC w/Diff/Platelet - Comprehensive Metabolic Panel (CMET)  3. Gastroesophageal reflux disease, unspecified whether esophagitis present  - Lipid panel - TSH - CBC w/Diff/Platelet - Comprehensive Metabolic Panel (CMET)  4. Seasonal allergic rhinitis due to pollen  - Lipid panel - TSH - CBC w/Diff/Platelet - Comprehensive Metabolic Panel (CMET)  5. Anxiety  - ALPRAZolam (XANAX) 0.5 MG tablet; TAKE 1 TABLET  BY MOUTH ONCE DAILY *NEED TO SCHEDULE OFFICE VISIT FOR FURTHER REFILLS*  Dispense: 30 tablet; Refill: 4  6. Intractable migraine with aura without status migrainosus  - gabapentin (NEURONTIN) 600 MG tablet; Take 1 tablet (600 mg total) by mouth at bedtime.  Dispense: 90 tablet; Refill: 0  7. Adjustment disorder with mixed anxiety and depressed mood Increased sertraline from 150 to 200 mg daily. - Sertraline HCl 200 MG CAPS; Take 200 mg by mouth daily.  Dispense: 90 capsule; Refill: 1   Return in about 4 months (around 10/03/2022).     {provider attestation***:1}   RWilhemena Durie MD  [phone](phone) [phone](fax)  [facility]

## 2022-06-03 ENCOUNTER — Encounter: Payer: Self-pay | Admitting: Obstetrics and Gynecology

## 2022-06-03 ENCOUNTER — Ambulatory Visit (INDEPENDENT_AMBULATORY_CARE_PROVIDER_SITE_OTHER): Payer: 59 | Admitting: Family Medicine

## 2022-06-03 ENCOUNTER — Encounter: Payer: Self-pay | Admitting: Family Medicine

## 2022-06-03 ENCOUNTER — Ambulatory Visit (INDEPENDENT_AMBULATORY_CARE_PROVIDER_SITE_OTHER): Payer: BC Managed Care – PPO | Admitting: Obstetrics and Gynecology

## 2022-06-03 ENCOUNTER — Other Ambulatory Visit (HOSPITAL_COMMUNITY)
Admission: RE | Admit: 2022-06-03 | Discharge: 2022-06-03 | Disposition: A | Discharge status: Home / Self Care | Payer: 59 | Source: Ambulatory Visit | Attending: Obstetrics and Gynecology | Admitting: Obstetrics and Gynecology

## 2022-06-03 VITALS — BP 118/70 | Ht 64.0 in | Wt 183.0 lb

## 2022-06-03 DIAGNOSIS — Z124 Encounter for screening for malignant neoplasm of cervix: Secondary | ICD-10-CM | POA: Diagnosis not present

## 2022-06-03 DIAGNOSIS — N951 Menopausal and female climacteric states: Secondary | ICD-10-CM

## 2022-06-03 DIAGNOSIS — Z01419 Encounter for gynecological examination (general) (routine) without abnormal findings: Secondary | ICD-10-CM | POA: Diagnosis not present

## 2022-06-03 DIAGNOSIS — G8929 Other chronic pain: Secondary | ICD-10-CM | POA: Diagnosis not present

## 2022-06-03 DIAGNOSIS — Z1151 Encounter for screening for human papillomavirus (HPV): Secondary | ICD-10-CM

## 2022-06-03 DIAGNOSIS — R69 Illness, unspecified: Secondary | ICD-10-CM | POA: Diagnosis not present

## 2022-06-03 DIAGNOSIS — M25512 Pain in left shoulder: Secondary | ICD-10-CM

## 2022-06-03 DIAGNOSIS — Z1231 Encounter for screening mammogram for malignant neoplasm of breast: Secondary | ICD-10-CM | POA: Diagnosis not present

## 2022-06-03 DIAGNOSIS — G43119 Migraine with aura, intractable, without status migrainosus: Secondary | ICD-10-CM | POA: Diagnosis not present

## 2022-06-03 DIAGNOSIS — K219 Gastro-esophageal reflux disease without esophagitis: Secondary | ICD-10-CM

## 2022-06-03 DIAGNOSIS — J301 Allergic rhinitis due to pollen: Secondary | ICD-10-CM | POA: Diagnosis not present

## 2022-06-03 DIAGNOSIS — Z1211 Encounter for screening for malignant neoplasm of colon: Secondary | ICD-10-CM

## 2022-06-03 DIAGNOSIS — Z23 Encounter for immunization: Secondary | ICD-10-CM

## 2022-06-03 DIAGNOSIS — N898 Other specified noninflammatory disorders of vagina: Secondary | ICD-10-CM | POA: Diagnosis not present

## 2022-06-03 DIAGNOSIS — E785 Hyperlipidemia, unspecified: Secondary | ICD-10-CM

## 2022-06-03 DIAGNOSIS — F4323 Adjustment disorder with mixed anxiety and depressed mood: Secondary | ICD-10-CM

## 2022-06-03 DIAGNOSIS — F419 Anxiety disorder, unspecified: Secondary | ICD-10-CM | POA: Diagnosis not present

## 2022-06-03 DIAGNOSIS — Z0000 Encounter for general adult medical examination without abnormal findings: Secondary | ICD-10-CM | POA: Diagnosis not present

## 2022-06-03 DIAGNOSIS — Z79890 Hormone replacement therapy (postmenopausal): Secondary | ICD-10-CM

## 2022-06-03 LAB — POCT WET PREP WITH KOH
Clue Cells Wet Prep HPF POC: NEGATIVE
KOH Prep POC: NEGATIVE
Trichomonas, UA: NEGATIVE
Yeast Wet Prep HPF POC: NEGATIVE

## 2022-06-03 MED ORDER — CLOTRIMAZOLE-BETAMETHASONE 1-0.05 % EX CREA: TOPICAL_CREAM | CUTANEOUS | 0 refills | Status: DC

## 2022-06-03 MED ORDER — PROGESTERONE MICRONIZED 100 MG PO CAPS: ORAL_CAPSULE | ORAL | 3 refills | Status: DC

## 2022-06-03 MED ORDER — ALPRAZOLAM 0.5 MG PO TABS: ORAL_TABLET | ORAL | 4 refills | Status: DC

## 2022-06-03 MED ORDER — GABAPENTIN 600 MG PO TABS: 600.0000 mg | ORAL_TABLET | Freq: Every day | ORAL | 0 refills | Status: DC

## 2022-06-03 MED ORDER — CELECOXIB 200 MG PO CAPS: 200.0000 mg | ORAL_CAPSULE | Freq: Every day | ORAL | 0 refills | Status: DC

## 2022-06-03 MED ORDER — SERTRALINE HCL 200 MG PO CAPS: 200.0000 mg | ORAL_CAPSULE | Freq: Every day | ORAL | 1 refills | Status: DC

## 2022-06-03 MED ORDER — TEMAZEPAM 30 MG PO CAPS
30.0000 mg | ORAL_CAPSULE | Freq: Every evening | ORAL | 0 refills | Status: DC | PRN
Start: 2022-06-03 — End: 2024-02-06

## 2022-06-03 MED ORDER — ESTRADIOL 0.5 MG PO TABS
0.5000 mg | ORAL_TABLET | Freq: Every day | ORAL | 3 refills | Status: DC
Start: 2022-06-03 — End: 2023-07-05

## 2022-06-03 MED ORDER — CETIRIZINE HCL 10 MG PO TABS
10.0000 mg | ORAL_TABLET | Freq: Every day | ORAL | 11 refills | Status: DC
Start: 2022-06-03 — End: 2023-06-07

## 2022-06-03 NOTE — Patient Instructions (Signed)
You can reach Mr. Elizabeth Munoz at [phone]

## 2022-06-03 NOTE — Patient Instructions (Addendum)
I value your feedback and you entrusting us with your care. If you get a [place] patient survey, I would appreciate you taking the time to let us know about your experience today. Thank you!  [facility]: [phone]  [facility]: [phone]    

## 2022-06-04 LAB — CBC WITH DIFFERENTIAL/PLATELET
Basophils Absolute: 0 10*3/uL (ref 0.0–0.2)
Basos: 1 %
EOS (ABSOLUTE): 0.1 10*3/uL (ref 0.0–0.4)
Eos: 2 %
Hematocrit: 41 % (ref 34.0–46.6)
Hemoglobin: 13.2 g/dL (ref 11.1–15.9)
Immature Grans (Abs): 0 10*3/uL (ref 0.0–0.1)
Immature Granulocytes: 0 %
Lymphocytes Absolute: 2.2 10*3/uL (ref 0.7–3.1)
Lymphs: 38 %
MCH: 27.1 pg (ref 26.6–33.0)
MCHC: 32.2 g/dL (ref 31.5–35.7)
MCV: 84 fL (ref 79–97)
Monocytes Absolute: 0.4 10*3/uL (ref 0.1–0.9)
Monocytes: 7 %
Neutrophils Absolute: 3 10*3/uL (ref 1.4–7.0)
Neutrophils: 52 %
Platelets: 320 10*3/uL (ref 150–450)
RBC: 4.87 x10E6/uL (ref 3.77–5.28)
RDW: 13.4 % (ref 11.7–15.4)
WBC: 5.9 10*3/uL (ref 3.4–10.8)

## 2022-06-04 LAB — COMPREHENSIVE METABOLIC PANEL
ALT: 14 IU/L (ref 0–32)
AST: 16 IU/L (ref 0–40)
Albumin/Globulin Ratio: 1.6 (ref 1.2–2.2)
Albumin: 4.7 g/dL (ref 3.8–4.9)
Alkaline Phosphatase: 103 IU/L (ref 44–121)
BUN/Creatinine Ratio: 30 — ABNORMAL HIGH (ref 9–23)
BUN: 19 mg/dL (ref 6–24)
Bilirubin Total: 0.2 mg/dL (ref 0.0–1.2)
CO2: 23 mmol/L (ref 20–29)
Calcium: 9.9 mg/dL (ref 8.7–10.2)
Chloride: 102 mmol/L (ref 96–106)
Creatinine, Ser: 0.63 mg/dL (ref 0.57–1.00)
Globulin, Total: 2.9 g/dL (ref 1.5–4.5)
Glucose: 85 mg/dL (ref 70–99)
Potassium: 4.5 mmol/L (ref 3.5–5.2)
Sodium: 139 mmol/L (ref 134–144)
Total Protein: 7.6 g/dL (ref 6.0–8.5)
eGFR: 103 mL/min/{1.73_m2} (ref >59)

## 2022-06-04 LAB — LIPID PANEL
Chol/HDL Ratio: 4.7 ratio — ABNORMAL HIGH (ref 0.0–4.4)
Cholesterol, Total: 247 mg/dL — ABNORMAL HIGH (ref 100–199)
HDL: 53 mg/dL (ref >39)
LDL Chol Calc (NIH): 163 mg/dL — ABNORMAL HIGH (ref 0–99)
Triglycerides: 172 mg/dL — ABNORMAL HIGH (ref 0–149)
VLDL Cholesterol Cal: 31 mg/dL (ref 5–40)

## 2022-06-04 LAB — TSH: TSH: 1.64 u[IU]/mL (ref 0.450–4.500)

## 2022-06-07 LAB — CYTOLOGY - PAP
Adequacy: ABSENT
Comment: NEGATIVE
Diagnosis: NEGATIVE
Diagnosis: REACTIVE
High risk HPV: NEGATIVE

## 2022-08-03 ENCOUNTER — Ambulatory Visit (INDEPENDENT_AMBULATORY_CARE_PROVIDER_SITE_OTHER): Payer: BC Managed Care – PPO | Admitting: Physician Assistant

## 2022-08-03 ENCOUNTER — Other Ambulatory Visit: Payer: Self-pay | Admitting: Family Medicine

## 2022-08-03 DIAGNOSIS — Z23 Encounter for immunization: Secondary | ICD-10-CM | POA: Diagnosis not present

## 2022-08-03 DIAGNOSIS — R1010 Upper abdominal pain, unspecified: Secondary | ICD-10-CM

## 2022-08-03 MED ORDER — ZOSTER VAC RECOMB ADJUVANTED 50 MCG/0.5ML IM SUSR: 0.5000 mL | Freq: Once | INTRAMUSCULAR | 0 refills | Status: DC

## 2022-10-07 ENCOUNTER — Ambulatory Visit: Payer: BC Managed Care – PPO | Admitting: Family Medicine

## 2022-10-25 ENCOUNTER — Other Ambulatory Visit: Payer: Self-pay | Admitting: Family Medicine

## 2022-10-25 NOTE — Telephone Encounter (Addendum)
[facility] faxed refill request for the following medications:   metoCLOPramide (REGLAN) 5 MG tablet     Please advise.

## 2022-10-26 NOTE — Telephone Encounter (Signed)
Please Review and advise LOV:06/03/2022 LR:03/01/2022 NOV: None

## 2022-10-27 MED ORDER — METOCLOPRAMIDE HCL 5 MG PO TABS: ORAL_TABLET | ORAL | 0 refills | Status: DC

## 2022-11-04 ENCOUNTER — Ambulatory Visit: Payer: Self-pay

## 2022-11-04 ENCOUNTER — Ambulatory Visit: Payer: Self-pay | Admitting: *Deleted

## 2022-11-04 NOTE — Telephone Encounter (Signed)
Appt 11/05/22 with Dr. Sherrie Mustache.

## 2022-11-04 NOTE — Telephone Encounter (Addendum)
Reason for Disposition  Third attempt to contact caller AND no contact made. Phone number verified.    Pt has appt tomorrow.  Protocols used: No Contact or Duplicate Contact Call-A-AH Called pt on home and mobile numbers and unable to LM. Noted pt has appt tomorrow. Routing to office.

## 2022-11-04 NOTE — Telephone Encounter (Signed)
Per agent pt called reporting "Sinus infection." Mentioned SOB, transferred to triage. Call dropped or pt hung up during transfer.  Attempted to reach pt x2,  recording, "Call cannot be completed as dialed," verified number, several attempts. Called home number listed , "Mail box full and cannot accept messages at this time." Pt requested appt with MD only, virtual. Agent secured first available with Dr. Sherrie Mustache Monday 11/08/22.

## 2022-11-04 NOTE — Telephone Encounter (Signed)
Called pt on cell # and got busy tone and call cannot be completed message and then husband's VM full.   Patient experiencing sinus infection, sinus drainage, mucinex is not working

## 2022-11-04 NOTE — Progress Notes (Signed)
MyChart Video Visit    Virtual Visit via Video Note   This format is felt to be most appropriate for this patient at this time. Physical exam was limited by quality of the video and audio technology used for the visit.   Patient location: home Provider location: [facility]  I discussed the limitations of evaluation and management by telemedicine and the availability of in person appointments. The patient expressed understanding and agreed to proceed.  Patient: Elizabeth Munoz   DOB: 28-Nov-1966   56 y.o. Female  MRN: [id number] Visit Date: 11/05/2022  Today's healthcare provider: Mila Merry, MD   Chief Complaint  Patient presents with   Sinus Problem   Subjective    Sinus Problem This is a recurrent problem. The current episode started more than 1 month ago (1 month ago). The problem is unchanged. There has been no fever. Associated symptoms include coughing, a hoarse voice and sinus pressure. Pertinent negatives include no chills or shortness of breath. (Dry throat) Treatments tried: Mucinex and Delsym. The treatment provided no relief.  States is still taking fluticasone and Zyrtec every day. Has a history of recurrent sinus infections that usually respond to Augmentin or Amoxicillin.   Medications: Outpatient Medications Prior to Visit  Medication Sig   ALPRAZolam (XANAX) 0.5 MG tablet TAKE 1 TABLET BY MOUTH ONCE DAILY *NEED TO SCHEDULE OFFICE VISIT FOR FURTHER REFILLS*   butalbital-acetaminophen-caffeine (FIORICET, ESGIC) 50-325-40 MG tablet TAKE ONE TO TWO TABLETS EVERY FOUR HOURSAS NEEDED FOR HEADACHE   celecoxib (CELEBREX) 200 MG capsule Take 1 capsule (200 mg total) by mouth daily.   cetirizine (ZYRTEC) 10 MG tablet Take 1 tablet (10 mg total) by mouth daily.   clotrimazole-betamethasone (LOTRISONE) cream Apply externally BID prn sx up to 2 wks   dexlansoprazole (DEXILANT) 60 MG capsule Take by mouth.   diclofenac (VOLTAREN) 75 MG EC tablet Take 75 mg by mouth 2 (two)  times daily.   estradiol (ESTRACE) 0.5 MG tablet Take 1 tablet (0.5 mg total) by mouth daily.   fluticasone (FLONASE) 50 MCG/ACT nasal spray TAKE 2 SPRAYS INTO EACH NOSTRIL DAILY   furosemide (LASIX) 20 MG tablet Take by mouth.   gabapentin (NEURONTIN) 600 MG tablet Take 1 tablet (600 mg total) by mouth at bedtime.   meloxicam (MOBIC) 15 MG tablet Take 1 tablet (15 mg total) by mouth daily.   metoCLOPramide (REGLAN) 5 MG tablet TAKE 1 TABLET BY MOUTH 3 TIMES DAILY BEFORE MEALS   Multiple Vitamin (MULTIVITAMIN) tablet Take 1 tablet by mouth daily.   naproxen (NAPROSYN) 375 MG tablet Take 1.5 tablets (562.5 mg total) by mouth 2 (two) times daily as needed.   omeprazole (PRILOSEC) 20 MG capsule TAKE 1 CAPSULE BY MOUTH TWICE DAILY BEFORE MEAL   pantoprazole (PROTONIX) 40 MG tablet TAKE 1 TABLET BY MOUTH TWICE DAILY   polyethylene glycol powder (QC NATURA-LAX) 17 GM/SCOOP powder TAKE 17 GRAMS EVERY DAY   progesterone (PROMETRIUM) 100 MG capsule Take 1 cap nightly, 6 nights on, 1 night off   RABEprazole (ACIPHEX) 20 MG tablet Take 20 mg by mouth 2 (two) times daily.   Rimegepant Sulfate (NURTEC) 75 MG TBDP Take 75 mg by mouth daily.   rizatriptan (MAXALT) 10 MG tablet Take 1 tab at onset of headache, May repeat in 2 hours if needed; max up to 30 mg daily   Sertraline HCl 200 MG CAPS Take 200 mg by mouth daily.   sucralfate (CARAFATE) 1 g tablet TAKE ONE TABLET FOUR TIMES  DAILY WITH MEALS AND BEDTIME   temazepam (RESTORIL) 30 MG capsule Take 1 capsule (30 mg total) by mouth at bedtime as needed.   Verapamil HCl CR 200 MG CP24 TAKE 1 CAPSULE BY MOUTH ONCE DAILY   No facility-administered medications prior to visit.    Review of Systems  Constitutional:  Negative for appetite change, chills, fatigue and fever.  HENT:  Positive for hoarse voice, postnasal drip and sinus pressure.   Respiratory:  Positive for cough. Negative for chest tightness and shortness of breath.   Cardiovascular:  Negative  for chest pain and palpitations.  Gastrointestinal:  Negative for abdominal pain, nausea and vomiting.  Neurological:  Negative for dizziness and weakness.       Objective    There were no vitals taken for this visit.    Physical Exam   Awake, alert, oriented x 3. In no apparent distress    Assessment & Plan     1. Chronic frontal sinusitis  - amoxicillin-clavulanate (AUGMENTIN) 875-125 MG tablet; Take 1 tablet by mouth 2 (two) times daily.  Dispense: 20 tablet; Refill: 0   Video connection was lost when less than 50% of the duration of the visit was complete, at which time the remainder of the visit was completed via audio only.     I discussed the assessment and treatment plan with the patient. The patient was provided an opportunity to ask questions and all were answered. The patient agreed with the plan and demonstrated an understanding of the instructions.   The patient was advised to call back or seek an in-person evaluation if the symptoms worsen or if the condition fails to improve as anticipated.  I provided 15 minutes of non-face-to-face time during this encounter.  The entirety of the information documented in the History of Present Illness, Review of Systems and Physical Exam were personally obtained by me. Portions of this information were initially documented by the CMA and reviewed by me for thoroughness and accuracy.    Mila Merry, MD [facility] [phone] (phone) [phone] (fax)  [facility]

## 2022-11-05 ENCOUNTER — Encounter: Payer: Self-pay | Admitting: Family Medicine

## 2022-11-05 ENCOUNTER — Telehealth (INDEPENDENT_AMBULATORY_CARE_PROVIDER_SITE_OTHER): Payer: BC Managed Care – PPO | Admitting: Family Medicine

## 2022-11-05 DIAGNOSIS — J321 Chronic frontal sinusitis: Secondary | ICD-10-CM

## 2022-11-05 MED ORDER — AMOXICILLIN-POT CLAVULANATE 875-125 MG PO TABS: 1.0000 | ORAL_TABLET | Freq: Two times a day (BID) | ORAL | 0 refills | Status: DC

## 2022-11-08 ENCOUNTER — Telehealth: Payer: BC Managed Care – PPO | Admitting: Family Medicine

## 2022-11-25 ENCOUNTER — Other Ambulatory Visit: Payer: Self-pay | Admitting: Obstetrics and Gynecology

## 2022-11-25 DIAGNOSIS — N898 Other specified noninflammatory disorders of vagina: Secondary | ICD-10-CM

## 2022-11-29 ENCOUNTER — Other Ambulatory Visit: Payer: Self-pay | Admitting: Family Medicine

## 2022-11-29 DIAGNOSIS — G43119 Migraine with aura, intractable, without status migrainosus: Secondary | ICD-10-CM

## 2022-11-29 NOTE — Telephone Encounter (Signed)
[facility] faxed refill request for the following medications:  gabapentin (NEURONTIN) 600 MG tablet     Please advise.

## 2022-11-29 NOTE — Telephone Encounter (Signed)
LOV 11/05/2022-acute NOV: none

## 2022-11-30 MED ORDER — GABAPENTIN 600 MG PO TABS: 600.0000 mg | ORAL_TABLET | Freq: Every day | ORAL | 0 refills | Status: DC

## 2022-12-02 ENCOUNTER — Ambulatory Visit: Payer: Self-pay | Admitting: Dermatology

## 2022-12-06 ENCOUNTER — Ambulatory Visit: Payer: Self-pay

## 2022-12-06 NOTE — Telephone Encounter (Signed)
Please advise 

## 2022-12-06 NOTE — Telephone Encounter (Signed)
Attempted to call patient on cell number, recording call could not be completed at this time.   Summary: Chronic frontal sinusitis & L Ear Pain Advice   Pt is calling to report that she was still with a virtual appt on 11/05/22 for Chronic frontal sinusitis. Pt reports that it has returned after being on an antibotic for 10 days. Pt reports L ear pain now. Please advise

## 2022-12-06 NOTE — Telephone Encounter (Signed)
No answer. Okay for pec to advise appt. Thanks.

## 2022-12-06 NOTE — Telephone Encounter (Signed)
Summary: Chronic frontal sinusitis & L Ear Pain Advice   Pt is calling to report that she was still with a virtual appt on 11/05/22 for Chronic frontal sinusitis. Pt reports that it has returned after being on an antibotic for 10 days. Pt reports L ear pain now. Please advise      Called patient [phone] to review sx. No answer, unable to leave VM . Mailbox full. Please advise.

## 2022-12-06 NOTE — Telephone Encounter (Signed)
Pt called, call cannot be completed at this time message received, tried husband cell as well but unable to LVM d/t VM full.   Summary: Chronic frontal sinusitis & L Ear Pain Advice   Pt is calling to report that she was still with a virtual appt on 11/05/22 for Chronic frontal sinusitis. Pt reports that it has returned after being on an antibotic for 10 days. Pt reports L ear pain now. Please advise

## 2022-12-06 NOTE — Telephone Encounter (Signed)
   Chief Complaint: sinus pressure, ear pressure Symptoms: ear pressure- popping, sinus pressure, headache Frequency: patient states her symptoms got better for 1 week and then symptoms came back Pertinent Negatives: Patient denies fever Disposition: [] ED /[] Urgent Care (no appt availability in office) / [] Appointment(In office/virtual)/ []  [facility]/ [] Home Care/ [x] Refused Recommended Disposition /[] [facility]/ []  Follow-up with PCP Additional Notes: Patient is requesting re-treatment-  she states Dr treated her with Zithromax before.Patient advised she may need to be seen- patient states she is a and does not want to bring whatever she has into the office.  Reason for Disposition  Earache  (Exceptions: brief ear pain of < 60 minutes duration, earache occurring during air travel  Answer Assessment - Initial Assessment Questions 1. LOCATION: "Which ear is involved?"     Left ear- irritation- popping in ear 2. ONSET: "When did the ear start hurting"      pressure 3. SEVERITY: "How bad is the pain?"  (Scale 1-10; mild, moderate or severe)   - MILD (1-3): doesn't interfere with normal activities    - MODERATE (4-7): interferes with normal activities or awakens from sleep    - SEVERE (8-10): excruciating pain, unable to do any normal activities      mild 4. URI SYMPTOMS: "Do you have a runny nose or cough?"     Runny nose- blood and yellow 5. FEVER: "Do you have a fever?" If Yes, ask: "What is your temperature, how was it measured, and when did it start?"     no 6. CAUSE: "Have you been swimming recently?", "How often do you use Q-TIPS?", "Have you had any recent air travel or scuba diving?"     no 7. OTHER SYMPTOMS: "Do you have any other symptoms?" (e.g., headache, stiff neck, dizziness, vomiting, runny nose, decreased hearing)     Headache, pressure in face  Protocols used: Earache-A-AH

## 2022-12-07 ENCOUNTER — Encounter: Payer: Self-pay | Admitting: Physician Assistant

## 2022-12-07 ENCOUNTER — Telehealth: Payer: Self-pay

## 2022-12-07 ENCOUNTER — Telehealth (INDEPENDENT_AMBULATORY_CARE_PROVIDER_SITE_OTHER): Payer: BC Managed Care – PPO | Admitting: Physician Assistant

## 2022-12-07 DIAGNOSIS — J329 Chronic sinusitis, unspecified: Secondary | ICD-10-CM | POA: Diagnosis not present

## 2022-12-07 MED ORDER — AZELASTINE HCL 0.1 % NA SOLN: 1.0000 | Freq: Every day | NASAL | 1 refills | Status: DC

## 2022-12-07 MED ORDER — AZITHROMYCIN 250 MG PO TABS: ORAL_TABLET | ORAL | 0 refills | Status: AC

## 2022-12-07 NOTE — Telephone Encounter (Signed)
Copied from CRM [id number]. Topic: General - Call Back - No Documentation >> Dec 07, 2022 11:31 AM Elizabeth Munoz wrote: Pt returning call, pt states her phone does not ring and she will be near the phone at 1pm  Please assist further

## 2022-12-07 NOTE — Telephone Encounter (Signed)
Copied from CRM [id number]. Topic: General - Call Back - No Documentation >> Dec 07, 2022 11:31 AM Esperanza Sheets wrote: Pt returning call, pt states her phone does not ring and she will be near the phone at 1pm  Please assist further >> Dec 07, 2022  1:15 PM Ja-Kwan M wrote: Pt stated she is still waiting for a return call. Cb# [phone]

## 2022-12-07 NOTE — Progress Notes (Signed)
I,[name],acting as a Neurosurgeon for [employer], PA-C.,have documented all relevant documentation on the behalf of Elizabeth Ferguson, PA-C,as directed by  Elizabeth Ferguson, PA-C while in the presence of Elizabeth Ferguson, PA-C.  MyChart Video Visit    Virtual Visit via Video Note   This format is felt to be most appropriate for this patient at this time. Physical exam was limited by quality of the video and audio technology used for the visit.   Patient location: Home  Provider location: [facility]  I discussed the limitations of evaluation and management by telemedicine and the availability of in person appointments. The patient expressed understanding and agreed to proceed.  Patient: Elizabeth Munoz   DOB: 12-23-1965   56 y.o. Female  MRN: [id number] Visit Date: 12/07/2022  Today's healthcare provider: Alfredia Ferguson, PA-C   Cc. Left ear pain, nasal congestion, headaches, sinus pressure x 2 weeks   Subjective     Pt reports left ear popping, runny nose w/ bloody yellow mucus, continues severe nasal congestion, headaches, sore throat, lack of voice. She has finished a course of augmentin with some improvement, was seen 11/05/2022, but then symptoms returned. She is using mucinex, nyquil , and flonase.   Medications: Outpatient Medications Prior to Visit  Medication Sig   ALPRAZolam (XANAX) 0.5 MG tablet TAKE 1 TABLET BY MOUTH ONCE DAILY *NEED TO SCHEDULE OFFICE VISIT FOR FURTHER REFILLS*   butalbital-acetaminophen-caffeine (FIORICET, ESGIC) 50-325-40 MG tablet TAKE ONE TO TWO TABLETS EVERY FOUR HOURSAS NEEDED FOR HEADACHE   celecoxib (CELEBREX) 200 MG capsule Take 1 capsule (200 mg total) by mouth daily.   cetirizine (ZYRTEC) 10 MG tablet Take 1 tablet (10 mg total) by mouth daily.   clotrimazole-betamethasone (LOTRISONE) cream APPLY EXTERNALLY TWICE DAILY AS NEEDED FOR SYMPTOMS FOR UP TO 2 WEEKS   dexlansoprazole (DEXILANT) 60 MG capsule Take by mouth.    diclofenac (VOLTAREN) 75 MG EC tablet Take 75 mg by mouth 2 (two) times daily.   estradiol (ESTRACE) 0.5 MG tablet Take 1 tablet (0.5 mg total) by mouth daily.   fluticasone (FLONASE) 50 MCG/ACT nasal spray TAKE 2 SPRAYS INTO EACH NOSTRIL DAILY   furosemide (LASIX) 20 MG tablet Take by mouth.   gabapentin (NEURONTIN) 600 MG tablet Take 1 tablet (600 mg total) by mouth at bedtime.   meloxicam (MOBIC) 15 MG tablet Take 1 tablet (15 mg total) by mouth daily.   metoCLOPramide (REGLAN) 5 MG tablet TAKE 1 TABLET BY MOUTH 3 TIMES DAILY BEFORE MEALS   Multiple Vitamin (MULTIVITAMIN) tablet Take 1 tablet by mouth daily.   naproxen (NAPROSYN) 375 MG tablet Take 1.5 tablets (562.5 mg total) by mouth 2 (two) times daily as needed.   omeprazole (PRILOSEC) 20 MG capsule TAKE 1 CAPSULE BY MOUTH TWICE DAILY BEFORE MEAL   pantoprazole (PROTONIX) 40 MG tablet TAKE 1 TABLET BY MOUTH TWICE DAILY   polyethylene glycol powder (QC NATURA-LAX) 17 GM/SCOOP powder TAKE 17 GRAMS EVERY DAY   progesterone (PROMETRIUM) 100 MG capsule Take 1 cap nightly, 6 nights on, 1 night off   RABEprazole (ACIPHEX) 20 MG tablet Take 20 mg by mouth 2 (two) times daily.   Rimegepant Sulfate (NURTEC) 75 MG TBDP Take 75 mg by mouth daily.   rizatriptan (MAXALT) 10 MG tablet Take 1 tab at onset of headache, May repeat in 2 hours if needed; max up to 30 mg daily   Sertraline HCl 200 MG CAPS Take 200 mg by mouth daily.   sucralfate (CARAFATE)  1 g tablet TAKE ONE TABLET FOUR TIMES DAILY WITH MEALS AND BEDTIME   temazepam (RESTORIL) 30 MG capsule Take 1 capsule (30 mg total) by mouth at bedtime as needed.   Verapamil HCl CR 200 MG CP24 TAKE 1 CAPSULE BY MOUTH ONCE DAILY   [DISCONTINUED] amoxicillin-clavulanate (AUGMENTIN) 875-125 MG tablet Take 1 tablet by mouth 2 (two) times daily. (Patient not taking: Reported on 12/07/2022)   No facility-administered medications prior to visit.    Review of Systems  HENT:  Positive for ear pain and sinus  pressure.   Neurological:  Positive for headaches.    Objective    There were no vitals taken for this visit.    Physical Exam Constitutional:      Appearance: She is not ill-appearing.     Comments: Pt is hoarse  Neurological:     Mental Status: She is oriented to person, place, and time.  Psychiatric:        Mood and Affect: Mood normal.        Behavior: Behavior normal.      Assessment & Plan     Recurrent sinusitis Will try zpack, advised her to continue flonase, increase fluids Rx azelastine to use with flonase. If recurrent within the next month would refer to ent  Return if symptoms worsen or fail to improve.     I discussed the assessment and treatment plan with the patient. The patient was provided an opportunity to ask questions and all were answered. The patient agreed with the plan and demonstrated an understanding of the instructions.   The patient was advised to call back or seek an in-person evaluation if the symptoms worsen or if the condition fails to improve as anticipated.  I provided 10 minutes of non-face-to-face time during this encounter.  I, Elizabeth Ferguson, PA-C have reviewed all documentation for this visit. The documentation on  12/07/2022 for the exam, diagnosis, procedures, and orders are all accurate and complete.  Elizabeth Ferguson, PA-C [facility] [address] [place], [place], [address] Office: [phone] Fax: [phone]   [facility]

## 2022-12-08 NOTE — Telephone Encounter (Signed)
Patient had virtual visit yesterday °

## 2022-12-27 ENCOUNTER — Telehealth: Payer: Self-pay | Admitting: Family Medicine

## 2022-12-27 ENCOUNTER — Other Ambulatory Visit: Payer: Self-pay | Admitting: Family Medicine

## 2022-12-27 DIAGNOSIS — G43119 Migraine with aura, intractable, without status migrainosus: Secondary | ICD-10-CM

## 2022-12-27 NOTE — Telephone Encounter (Signed)
[facility] faxed refill request for the following medications:   ALPRAZolam (XANAX) 0.5 MG tablet     Please advise  

## 2022-12-28 NOTE — Telephone Encounter (Signed)
MyChart letter sent

## 2022-12-28 NOTE — Telephone Encounter (Signed)
Office visit needed to discuss medication and if refills needed  Elizabeth Foster, MD  [facility]

## 2023-01-03 ENCOUNTER — Other Ambulatory Visit: Payer: Self-pay | Admitting: Family Medicine

## 2023-01-03 DIAGNOSIS — F419 Anxiety disorder, unspecified: Secondary | ICD-10-CM

## 2023-01-04 ENCOUNTER — Telehealth (INDEPENDENT_AMBULATORY_CARE_PROVIDER_SITE_OTHER): Payer: BC Managed Care – PPO | Admitting: Physician Assistant

## 2023-01-04 ENCOUNTER — Ambulatory Visit: Payer: Self-pay

## 2023-01-04 ENCOUNTER — Telehealth: Payer: Self-pay

## 2023-01-04 ENCOUNTER — Telehealth: Payer: Self-pay | Admitting: Family Medicine

## 2023-01-04 ENCOUNTER — Encounter: Payer: Self-pay | Admitting: Physician Assistant

## 2023-01-04 ENCOUNTER — Other Ambulatory Visit: Payer: Self-pay | Admitting: Physician Assistant

## 2023-01-04 DIAGNOSIS — R1010 Upper abdominal pain, unspecified: Secondary | ICD-10-CM | POA: Diagnosis not present

## 2023-01-04 DIAGNOSIS — K219 Gastro-esophageal reflux disease without esophagitis: Secondary | ICD-10-CM

## 2023-01-04 MED ORDER — PANTOPRAZOLE SODIUM 40 MG PO TBEC: 40.0000 mg | DELAYED_RELEASE_TABLET | Freq: Two times a day (BID) | ORAL | 0 refills | Status: DC

## 2023-01-04 NOTE — Progress Notes (Signed)
I,[name],acting as a Education administrator for [employer], PA-C.,have documented all relevant documentation on the behalf of Mardene Speak, PA-C,as directed by  [employer], PA-C while in the presence of [employer], PA-C.  MyChart Video Visit    Virtual Visit via Video Note   This format is felt to be most appropriate for this patient at this time. Physical exam was limited by quality of the video and audio technology used for the visit.   Patient location: home Provider location: [facility]  I discussed the limitations of evaluation and management by telemedicine and the availability of in person appointments. The patient expressed understanding and agreed to proceed.  Patient: Elizabeth Munoz   DOB: 1966-01-25   57 y.o. Female  MRN: [id number] Visit Date: 01/04/2023  Today's healthcare provider: Mardene Speak, PA-C   CC: abdominal pain  Subjective    Abdominal Pain This is a recurrent problem. The current episode started in the past 7 days (Saturday). The onset quality is gradual. The problem occurs daily. The problem has been unchanged. The pain is located in the LUQ and RUQ. The pain is severe. The abdominal pain does not radiate. Associated symptoms comments: Acid reflux. Nothing aggravates the pain. Treatments tried: famotidine 20 mg. The treatment provided no relief.  Typical symptoms: acid regurgitation, heartburn, dysphagia (mostly postprandial)  Medications: Outpatient Medications Prior to Visit  Medication Sig   ALPRAZolam (XANAX) 0.5 MG tablet TAKE 1 TABLET BY MOUTH ONCE DAILY *NEED TO SCHEDULE OFFICE VISIT FOR FURTHER REFILLS*   azelastine (ASTELIN) 0.1 % nasal spray Place 1 spray into both nostrils daily. Use in each nostril as directed   butalbital-acetaminophen-caffeine (FIORICET, ESGIC) 50-325-40 MG tablet TAKE ONE TO TWO TABLETS EVERY FOUR HOURSAS NEEDED FOR HEADACHE   celecoxib (CELEBREX) 200 MG capsule Take 1 capsule (200 mg total) by  mouth daily.   cetirizine (ZYRTEC) 10 MG tablet Take 1 tablet (10 mg total) by mouth daily.   clotrimazole-betamethasone (LOTRISONE) cream APPLY EXTERNALLY TWICE DAILY AS NEEDED FOR SYMPTOMS FOR UP TO 2 WEEKS   dexlansoprazole (DEXILANT) 60 MG capsule Take by mouth.   diclofenac (VOLTAREN) 75 MG EC tablet Take 75 mg by mouth 2 (two) times daily.   estradiol (ESTRACE) 0.5 MG tablet Take 1 tablet (0.5 mg total) by mouth daily.   fluticasone (FLONASE) 50 MCG/ACT nasal spray TAKE 2 SPRAYS INTO EACH NOSTRIL DAILY   furosemide (LASIX) 20 MG tablet Take by mouth.   gabapentin (NEURONTIN) 600 MG tablet TAKE 1 TABLET BY MOUTH NIGHTLY   meloxicam (MOBIC) 15 MG tablet Take 1 tablet (15 mg total) by mouth daily.   metoCLOPramide (REGLAN) 5 MG tablet TAKE 1 TABLET BY MOUTH 3 TIMES DAILY BEFORE MEALS   Multiple Vitamin (MULTIVITAMIN) tablet Take 1 tablet by mouth daily.   naproxen (NAPROSYN) 375 MG tablet Take 1.5 tablets (562.5 mg total) by mouth 2 (two) times daily as needed.   omeprazole (PRILOSEC) 20 MG capsule TAKE 1 CAPSULE BY MOUTH TWICE DAILY BEFORE MEAL   pantoprazole (PROTONIX) 40 MG tablet TAKE 1 TABLET BY MOUTH TWICE DAILY   polyethylene glycol powder (QC NATURA-LAX) 17 GM/SCOOP powder TAKE 17 GRAMS EVERY DAY   progesterone (PROMETRIUM) 100 MG capsule Take 1 cap nightly, 6 nights on, 1 night off   RABEprazole (ACIPHEX) 20 MG tablet Take 20 mg by mouth 2 (two) times daily.   Rimegepant Sulfate (NURTEC) 75 MG TBDP Take 75 mg by mouth daily.   rizatriptan (MAXALT) 10 MG tablet Take  1 tab at onset of headache, May repeat in 2 hours if needed; max up to 30 mg daily   Sertraline HCl 200 MG CAPS Take 200 mg by mouth daily.   sucralfate (CARAFATE) 1 g tablet TAKE ONE TABLET FOUR TIMES DAILY WITH MEALS AND BEDTIME   temazepam (RESTORIL) 30 MG capsule Take 1 capsule (30 mg total) by mouth at bedtime as needed.   Verapamil HCl CR 200 MG CP24 TAKE 1 CAPSULE BY MOUTH ONCE DAILY   No facility-administered  medications prior to visit.    Review of Systems  Gastrointestinal:  Positive for abdominal pain.  See HPI     Objective    There were no vitals taken for this visit.     Physical Exam Constitutional:      General: She is not in acute distress.    Appearance: Normal appearance.  HENT:     Head: Normocephalic.  Pulmonary:     Effort: Pulmonary effort is normal. No respiratory distress.  Neurological:     Mental Status: She is alert and oriented to person, place, and time. Mental status is at baseline.        Assessment & Plan     1. Upper abdominal pain Most likely due to Gastroesophageal reflux disease Worsening of chronic condition Pt attributed it to new vitamins, she started to take them before onset of pain In the past, pt used famotidine, omeprazole, and other PPI Discussed with pt her treatment options Advised to start a trial of PPI: - pantoprazole (PROTONIX) 40 MG tablet; Take 1 tablet (40 mg total) by mouth 2 (two) times daily.  Dispense: 60 tablet; Refill: 0 - start with 1/2 and then increase to 1 tab , up to 1 tab BID Advised: Elevate the head of the bed 6-8 inches, avoid recumbency for 3 hours after eating, avoid trigger food , start food diary Pt requested a work note Was provided with work note.   FU as needed.  The patient was advised to call back or seek an in-person evaluation if the symptoms worsen or if the condition fails to improve as anticipated.  I discussed the assessment and treatment plan with the patient. The patient was provided an opportunity to ask questions and all were answered. The patient agreed with the plan and demonstrated an understanding of the instructions.   I provided 15 minutes of non-face-to-face time during this encounter.  The entirety of the information documented in the History of Present Illness, Review of Systems and Physical Exam were personally obtained by me. Portions of this information were initially  documented by the CMA and reviewed by me for thoroughness and accuracy.  Mardene Speak, [facility], [place] [phone] (phone) [phone] (fax)

## 2023-01-04 NOTE — Telephone Encounter (Signed)
  Chief Complaint: Stomach pain Symptoms: Extreme stomach pain Frequency: Saturday Pertinent Negatives: Patient denies Fever Disposition: [] ED /[] Urgent Care (no appt availability in office) / [x] Appointment(In office/virtual)/ []  [facility]/ [] Home Care/ [] Refused Recommended Disposition /[] [facility]/ []  Follow-up with PCP Additional Notes: PT has extreme stomach pain since Saturday. Pt has had this in the past. Pt prefers MyChart visit.

## 2023-01-04 NOTE — Telephone Encounter (Signed)
Copied from [place] [phone]. Topic: General - Other >> Jan 04, 2023  3:22 PM Everette C wrote: Reason for CRM: The patient has called to follow up on their request for a note excusing them from work tomorrow 01/05/23  Please contact the patient further

## 2023-01-04 NOTE — Telephone Encounter (Signed)
[facility] faxed refill request for the following medications:  omeprazole (PRILOSEC) 20 MG capsule    Please advise. 

## 2023-01-04 NOTE — Telephone Encounter (Signed)
Unable to refill per protocol, last refill by provider 01/04/23. Will refuse duplicate request.  Requested Prescriptions  Pending Prescriptions Disp Refills   pantoprazole (PROTONIX) 40 MG tablet [Pharmacy Med Name: PANTOPRAZOLE SODIUM 40 MG DR TAB] 60 tablet 0    Sig: TAKE ONE TABLET BY MOUTH TWICE DAILY     Gastroenterology: Proton Pump Inhibitors Passed - 01/04/2023  1:00 PM      Passed - Valid encounter within last 12 months    Recent Outpatient Visits           Today Gastroesophageal reflux disease   [facility] [place], [place], PA-C   4 weeks ago Recurrent sinusitis   [facility] [place], [place], PA-C   2 months ago Chronic frontal sinusitis   [facility] Birdie Sons, MD   7 months ago Annual physical exam   [facility] Jerrol Banana., MD   12 months ago Acute viral sinusitis   [facility] [place], Dani Gobble, [place]

## 2023-01-04 NOTE — Telephone Encounter (Signed)
Rx not sent // change in therapy

## 2023-01-04 NOTE — Telephone Encounter (Signed)
Reason for Disposition  [1] MILD-MODERATE pain AND [2] constant AND [3] present > 2 hours  Answer Assessment - Initial Assessment Questions 1. LOCATION: "Where does it hurt?"      Top of stomach 2. RADIATION: "Does the pain shoot anywhere else?" (e.g., chest, back)     no 3. ONSET: "When did the pain begin?" (e.g., minutes, hours or days ago)      Saturday 4. SUDDEN: "Gradual or sudden onset?"     slowly 5. PATTERN "Does the pain come and go, or is it constant?"    - If it comes and goes: "How long does it last?" "Do you have pain now?"     (Note: Comes and goes means the pain is intermittent. It goes away completely between bouts.)    - If constant: "Is it getting better, staying the same, or getting worse?"      (Note: Constant means the pain never goes away completely; most serious pain is constant and gets worse.)      continuous 6. SEVERITY: "How bad is the pain?"  (e.g., Scale 1-10; mild, moderate, or severe)    - MILD (1-3): Doesn't interfere with normal activities, abdomen soft and not tender to touch.     - MODERATE (4-7): Interferes with normal activities or awakens from sleep, abdomen tender to touch.     - SEVERE (8-10): Excruciating pain, doubled over, unable to do any normal activities.       Severe 7. RECURRENT SYMPTOM: "Have you ever had this type of stomach pain before?" If Yes, ask: "When was the last time?" and "What happened that time?"      Yes - 6 years ago had this. She had biopsy.  8. CAUSE: "What do you think is causing the stomach pain?"     Unsure 9. RELIEVING/AGGRAVATING FACTORS: "What makes it better or worse?" (e.g., antacids, bending or twisting motion, bowel movement)     Been in bed since Saturday - acid reflux 10. OTHER SYMPTOMS: "Do you have any other symptoms?" (e.g., back pain, diarrhea, fever, urination pain, vomiting)       No energy  11. PREGNANCY: "Is there any chance you are pregnant?" "When was your last menstrual period?"  Protocols used:  Abdominal Pain - [facility]

## 2023-01-04 NOTE — Telephone Encounter (Signed)
Noted switch to protonix from omeprazole  Will discard faxed request for omeprazole   Eulis Foster, MD  [facility]

## 2023-01-05 ENCOUNTER — Encounter: Payer: Self-pay | Admitting: Physician Assistant

## 2023-01-06 ENCOUNTER — Encounter: Payer: Self-pay | Admitting: Physician Assistant

## 2023-01-06 DIAGNOSIS — R197 Diarrhea, unspecified: Secondary | ICD-10-CM | POA: Diagnosis not present

## 2023-01-06 DIAGNOSIS — R1013 Epigastric pain: Secondary | ICD-10-CM | POA: Diagnosis not present

## 2023-01-31 ENCOUNTER — Other Ambulatory Visit: Payer: Self-pay | Admitting: Family Medicine

## 2023-01-31 DIAGNOSIS — G43119 Migraine with aura, intractable, without status migrainosus: Secondary | ICD-10-CM

## 2023-03-01 ENCOUNTER — Other Ambulatory Visit: Payer: Self-pay | Admitting: Family Medicine

## 2023-03-01 DIAGNOSIS — G43119 Migraine with aura, intractable, without status migrainosus: Secondary | ICD-10-CM

## 2023-03-02 ENCOUNTER — Encounter: Payer: Self-pay | Admitting: Physician Assistant

## 2023-03-02 ENCOUNTER — Telehealth (INDEPENDENT_AMBULATORY_CARE_PROVIDER_SITE_OTHER): Payer: BC Managed Care – PPO | Admitting: Physician Assistant

## 2023-03-02 DIAGNOSIS — J0110 Acute frontal sinusitis, unspecified: Secondary | ICD-10-CM

## 2023-03-02 DIAGNOSIS — J040 Acute laryngitis: Secondary | ICD-10-CM

## 2023-03-02 MED ORDER — AMOXICILLIN 875 MG PO TABS: 875.0000 mg | ORAL_TABLET | Freq: Two times a day (BID) | ORAL | 0 refills | Status: AC

## 2023-03-02 NOTE — Progress Notes (Signed)
I,[name],acting as a Education administrator for [employer], PA-C.,have documented all relevant documentation on the behalf of Mikey Kirschner, PA-C,as directed by  Mikey Kirschner, PA-C while in the presence of Mikey Kirschner, PA-C.   MyChart Video Visit    Virtual Visit via Video Note   This format is felt to be most appropriate for this patient at this time. Physical exam was limited by quality of the video and audio technology used for the visit.   Patient location: [place] Provider location: [facility]  I discussed the limitations of evaluation and management by telemedicine and the availability of in person appointments. The patient expressed understanding and agreed to proceed.  Patient: Elizabeth Munoz   DOB: 03-20-1966   57 y.o. Female  MRN: [id number] Visit Date: 03/02/2023  Today's healthcare provider: Mikey Kirschner, PA-C   Cc. Sore throat, nasal congestion, headaches, hoarseness x 1 week  Subjective     Wife and husband has previously tested positive for covid and was taking paxlovid. She returned back to work and began feeling bad on Thursday. Reports nasal congestion, headache, hoarseness, cough, sore throat x 1 week. Reports trying a mucinex product otc. Patient reports missing work today and needing a note for coverage  Medications: Outpatient Medications Prior to Visit  Medication Sig   ALPRAZolam (XANAX) 0.5 MG tablet TAKE 1 TABLET BY MOUTH ONCE DAILY *NEED TO SCHEDULE OFFICE VISIT FOR FURTHER REFILLS*   azelastine (ASTELIN) 0.1 % nasal spray Place 1 spray into both nostrils daily. Use in each nostril as directed   butalbital-acetaminophen-caffeine (FIORICET, ESGIC) 50-325-40 MG tablet TAKE ONE TO TWO TABLETS EVERY FOUR HOURSAS NEEDED FOR HEADACHE   celecoxib (CELEBREX) 200 MG capsule Take 1 capsule (200 mg total) by mouth daily.   cetirizine (ZYRTEC) 10 MG tablet Take 1 tablet (10 mg total) by mouth daily.   clotrimazole-betamethasone (LOTRISONE)  cream APPLY EXTERNALLY TWICE DAILY AS NEEDED FOR SYMPTOMS FOR UP TO 2 WEEKS   diclofenac (VOLTAREN) 75 MG EC tablet Take 75 mg by mouth 2 (two) times daily.   estradiol (ESTRACE) 0.5 MG tablet Take 1 tablet (0.5 mg total) by mouth daily.   fluticasone (FLONASE) 50 MCG/ACT nasal spray TAKE 2 SPRAYS INTO EACH NOSTRIL DAILY   furosemide (LASIX) 20 MG tablet Take by mouth.   gabapentin (NEURONTIN) 600 MG tablet TAKE 1 TABLET BY MOUTH NIGHTLY   meloxicam (MOBIC) 15 MG tablet Take 1 tablet (15 mg total) by mouth daily.   metoCLOPramide (REGLAN) 5 MG tablet TAKE ONE TABLET BY MOUTH 3 TIMES DAILY BEFORE MEALS   Multiple Vitamin (MULTIVITAMIN) tablet Take 1 tablet by mouth daily.   naproxen (NAPROSYN) 375 MG tablet Take 1.5 tablets (562.5 mg total) by mouth 2 (two) times daily as needed.   pantoprazole (PROTONIX) 40 MG tablet Take 1 tablet (40 mg total) by mouth 2 (two) times daily.   polyethylene glycol powder (QC NATURA-LAX) 17 GM/SCOOP powder TAKE 17 GRAMS EVERY DAY   progesterone (PROMETRIUM) 100 MG capsule Take 1 cap nightly, 6 nights on, 1 night off   Rimegepant Sulfate (NURTEC) 75 MG TBDP Take 75 mg by mouth daily.   rizatriptan (MAXALT) 10 MG tablet Take 1 tab at onset of headache, May repeat in 2 hours if needed; max up to 30 mg daily   Sertraline HCl 200 MG CAPS Take 200 mg by mouth daily.   sucralfate (CARAFATE) 1 g tablet TAKE ONE TABLET FOUR TIMES DAILY WITH MEALS AND BEDTIME   temazepam (RESTORIL) 30 MG  capsule Take 1 capsule (30 mg total) by mouth at bedtime as needed.   Verapamil HCl CR 200 MG CP24 TAKE 1 CAPSULE BY MOUTH ONCE DAILY   No facility-administered medications prior to visit.    Review of Systems  Respiratory:  Positive for cough.   Neurological:  Positive for headaches.     Objective    There were no vitals taken for this visit.   Physical Exam Constitutional:      Comments: Hoarse voice  Neurological:     Mental Status: She is oriented to person, place, and  time.  Psychiatric:        Mood and Affect: Mood normal.        Behavior: Behavior normal.        Assessment & Plan     Acute sinusitis 2. 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     I discussed the assessment and treatment plan with the patient. The patient was provided an opportunity to ask questions and all were answered. The patient agreed with the plan and demonstrated an understanding of the instructions.   The patient was advised to call back or seek an in-person evaluation if the symptoms worsen or if the condition fails to improve as anticipated.  I provided 7 minutes of non-face-to-face time during this encounter.  I, Mikey Kirschner, PA-C have reviewed all documentation for this visit. The documentation on  03/02/23  for the exam, diagnosis, procedures, and orders are all accurate and complete.  Mikey Kirschner, PA-C [facility] [address] [place], [place], [address] Office: [phone] Fax: [place]

## 2023-03-04 ENCOUNTER — Ambulatory Visit: Payer: Self-pay

## 2023-03-04 ENCOUNTER — Encounter: Payer: Self-pay | Admitting: Physician Assistant

## 2023-03-04 ENCOUNTER — Other Ambulatory Visit: Payer: Self-pay | Admitting: Physician Assistant

## 2023-03-04 DIAGNOSIS — G43811 Other migraine, intractable, with status migrainosus: Secondary | ICD-10-CM

## 2023-03-04 DIAGNOSIS — G43839 Menstrual migraine, intractable, without status migrainosus: Secondary | ICD-10-CM

## 2023-03-04 MED ORDER — NURTEC 75 MG PO TBDP: 75.0000 mg | ORAL_TABLET | Freq: Every day | ORAL | 0 refills | Status: DC

## 2023-03-04 NOTE — Telephone Encounter (Signed)
Patient C/O persistent migraine x 1 week. Patient is requesting medication for migraine to be sent to [facility].

## 2023-03-04 NOTE — Telephone Encounter (Signed)
Pt called, unable to LVM on cell # d/t "call cannot be completed.." and called on home # [id number] on husband's cell.   Summary: Pt still not feeling well after appt and requests that a nurse return her call   Pt stated she had an appt with Mikey Kirschner this week but she still is not feeling well and she would like to speak with a nurse. Pt stated that she has questions/concerns and would like for a nurse to return her call at [phone]

## 2023-03-04 NOTE — Telephone Encounter (Signed)
2nd attempt, called pts cell #, got busy tone. Called husband cell and VM full. Will route to provider d/t no contact.

## 2023-03-07 ENCOUNTER — Telehealth: Payer: Self-pay | Admitting: Physician Assistant

## 2023-03-07 NOTE — Telephone Encounter (Signed)
[facility] is requesting prior authorization  [employer]phase: [id number] Name: Elizabeth Munoz Nurtec 75mg  Dispersible Tablets

## 2023-03-08 NOTE — Telephone Encounter (Signed)
Elizabeth Munoz (Key: [id number]) [id number] Nurtec 75MG  dispersible tablets Status: PA RequestCreated: March 15th, 2024 [id number] Sent: March 19th, 2024

## 2023-05-02 ENCOUNTER — Other Ambulatory Visit: Payer: Self-pay | Admitting: Family Medicine

## 2023-05-02 DIAGNOSIS — F419 Anxiety disorder, unspecified: Secondary | ICD-10-CM

## 2023-05-02 DIAGNOSIS — G43119 Migraine with aura, intractable, without status migrainosus: Secondary | ICD-10-CM

## 2023-05-28 ENCOUNTER — Other Ambulatory Visit: Payer: Self-pay | Admitting: Obstetrics and Gynecology

## 2023-05-28 DIAGNOSIS — Z79890 Hormone replacement therapy (postmenopausal): Secondary | ICD-10-CM

## 2023-05-28 DIAGNOSIS — N951 Menopausal and female climacteric states: Secondary | ICD-10-CM

## 2023-05-30 ENCOUNTER — Other Ambulatory Visit: Payer: Self-pay | Admitting: Family Medicine

## 2023-05-31 NOTE — Telephone Encounter (Signed)
Requested medication (s) are due for refill today: yes  Requested medication (s) are on the active medication list: yes  Last refill:  02/01/23  Future visit scheduled: no  Notes to clinic:  Unable to refill per protocol, cannot delegate.      Requested Prescriptions  Pending Prescriptions Disp Refills   metoCLOPramide (REGLAN) 5 MG tablet [Pharmacy Med Name: METOCLOPRAMIDE HCL 5 MG TAB] 90 tablet 0    Sig: TAKE ONE TABLET BY MOUTH 3 TIMES DAILY BEFORE MEALS     Not Delegated - Gastroenterology: Antiemetics - metoclopramide Failed - 05/30/2023 11:22 AM      Failed - This refill cannot be delegated      Failed - Cr in normal range and within 360 days    Creatinine, Ser  Date Value Ref Range Status  06/03/2022 0.63 0.57 - 1.00 mg/dL Final         Passed - Valid encounter within last 6 months    Recent Outpatient Visits           3 months ago Acute non-recurrent frontal sinusitis   [place] [facility] Ferguson, PA-C   4 months ago Pain localized to upper abdomen   [facility], [place], PA-C   5 months ago Recurrent sinusitis   [facility] Alfredia Ferguson, PA-C   6 months ago Chronic frontal sinusitis   [facility] Malva Limes, MD   12 months ago Annual physical exam   [facility] Bosie Clos, MD

## 2023-06-06 ENCOUNTER — Other Ambulatory Visit: Payer: Self-pay | Admitting: Family Medicine

## 2023-06-06 DIAGNOSIS — G43119 Migraine with aura, intractable, without status migrainosus: Secondary | ICD-10-CM

## 2023-06-07 NOTE — Telephone Encounter (Signed)
Requested Prescriptions  Pending Prescriptions Disp Refills   gabapentin (NEURONTIN) 600 MG tablet [Pharmacy Med Name: GABAPENTIN 600 MG TAB] 90 tablet 0    Sig: TAKE 1 TABLET BY MOUTH NIGHTLY     Neurology: Anticonvulsants - gabapentin Failed - 06/06/2023  1:31 PM      Failed - Cr in normal range and within 360 days    Creatinine, Ser  Date Value Ref Range Status  06/03/2022 0.63 0.57 - 1.00 mg/dL Final         Failed - Completed PHQ-2 or PHQ-9 in the last 360 days      Passed - Valid encounter within last 12 months    Recent Outpatient Visits           3 months ago Acute non-recurrent frontal sinusitis   [place] [facility] Alfredia Ferguson, PA-C   5 months ago Pain localized to upper abdomen   [place] [facility] [place], [place], PA-C   6 months ago Recurrent sinusitis   [place] Ferguson, PA-C   7 months ago Chronic frontal sinusitis   [place] [facility] Malva Limes, MD   1 year ago Annual physical exam   [facility] Bosie Clos, MD               cetirizine (ZYRTEC) 10 MG tablet [Pharmacy Med Name: CETIRIZINE HCL 10 MG TAB] 90 tablet 0    Sig: TAKE 1 TABLET BY MOUTH ONCE DAILY     Ear, Nose, and Throat:  Antihistamines 2 Failed - 06/06/2023  1:31 PM      Failed - Cr in normal range and within 360 days    Creatinine, Ser  Date Value Ref Range Status  06/03/2022 0.63 0.57 - 1.00 mg/dL Final         Passed - Valid encounter within last 12 months    Recent Outpatient Visits           3 months ago Acute non-recurrent frontal sinusitis   [place] [facility]ia Ferguson, PA-C   5 months ago Pain localized to upper abdomen   [place] [facility] [place], [place], PA-C   6 months ago Recurrent sinusitis   [place] [facility] [place], [place], PA-C   7 months ago Chronic frontal  sinusitis   [place] [facility] Malva Limes, MD   1 year ago Annual physical exam    [facility] Bosie Clos, MD               omeprazole (PRILOSEC) 20 MG capsule [Pharmacy Med Name: OMEPRAZOLE DR 20 MG CAP] 180 capsule 0    Sig: TAKE 1 CAPSULE BY MOUTH TWICE DAILY BEFORE MEAL     Gastroenterology: Proton Pump Inhibitors Passed - 06/06/2023  1:31 PM      Passed - Valid encounter within last 12 months    Recent Outpatient Visits           3 months ago Acute non-recurrent frontal sinusitis    [facility], [facility]-C   5 months ago Pain localized to upper abdomen   [facility] [place], [place], PA-C   6 months ago Recurrent sinusitis   [facility] Alfredia Ferguson, PA-C   7 months ago Chronic frontal sinusitis   [facility] Sherrie Mustache, Demetrios Isaacs, MD  1 year ago Annual physical exam   [place] [facility] Bosie Clos, MD               naproxen (NAPROSYN) 500 MG tablet [Pharmacy Med Name: NAPROXEN 500 MG TAB] 180 tablet 0    Sig: TAKE 1 TABLET BY MOUTH TWICE DAILY WITH A MEAL     Analgesics:  NSAIDS Failed - 06/06/2023  1:31 PM      Failed - Manual Review: Labs are only required if the patient has taken medication for more than 8 weeks.      Failed - Cr in normal range and within 360 days    Creatinine, Ser  Date Value Ref Range Status  06/03/2022 0.63 0.57 - 1.00 mg/dL Final         Failed - HGB in normal range and within 360 days    Hemoglobin  Date Value Ref Range Status  06/03/2022 13.2 11.1 - 15.9 g/dL Final         Failed - PLT in normal range and within 360 days    Platelets  Date Value Ref Range Status  06/03/2022 320 150 - 450 x10E3/uL Final         Failed - HCT in normal range and within 360 days    Hematocrit  Date Value Ref Range Status  06/03/2022 41.0 34.0  - 46.6 % Final         Failed - eGFR is 30 or above and within 360 days    GFR calc Af Amer  Date Value Ref Range Status  09/16/2020 114 >59 mL/min/1.73 Final    Comment:    **[facility] currently reports eGFR in compliance with the current**   recommendations of the [employer]. [facility] will   update reporting as new guidelines are published from the NKF-ASN   Task force.    GFR calc non Af Amer  Date Value Ref Range Status  09/16/2020 99 >59 mL/min/1.73 Final   eGFR  Date Value Ref Range Status  06/03/2022 103 >59 mL/min/1.73 Final         Passed - Patient is not pregnant      Passed - Valid encounter within last 12 months    Recent Outpatient Visits           3 months ago Acute non-recurrent frontal sinusitis   [place] [facility] Alfredia Ferguson, PA-C   5 months ago Pain localized to upper abdomen   [facility] [place], [place], PA-C   6 months ago Recurrent sinusitis   [facility] Alfredia Ferguson, PA-C   7 months ago Chronic frontal sinusitis   [facility] Malva Limes, MD   1 year ago Annual physical exam   [facility] Bosie Clos, MD

## 2023-06-16 ENCOUNTER — Other Ambulatory Visit: Payer: Self-pay | Admitting: Physician Assistant

## 2023-06-16 DIAGNOSIS — J329 Chronic sinusitis, unspecified: Secondary | ICD-10-CM

## 2023-06-17 NOTE — Telephone Encounter (Signed)
Requested Prescriptions  Pending Prescriptions Disp Refills   azelastine (ASTELIN) 0.1 % nasal spray [Pharmacy Med Name: AZELASTINE HCL 0.1% NASAL SOLN ML] 30 mL 0    Sig: USE 1 SPRAY IN BOTH NOSTRILS DAILY AS DIRECTED.     Ear, Nose, and Throat: Nasal Preparations - Antiallergy Passed - 06/16/2023  2:10 PM      Passed - Valid encounter within last 12 months    Recent Outpatient Visits           3 months ago Acute non-recurrent frontal sinusitis   [place] [facility] Ferguson, PA-C   5 months ago Pain localized to upper abdomen   [facility] [place], [place], PA-C   6 months ago Recurrent sinusitis   [facility] Alfred[facility] Ferguson, PA-C   7 months ago Chronic frontal sinusitis   [facility] Malva Limes, MD   1 year ago Annual physical exam   [facility] Bosie Clos, MD

## 2023-06-18 ENCOUNTER — Other Ambulatory Visit: Payer: Self-pay | Admitting: Obstetrics and Gynecology

## 2023-06-18 DIAGNOSIS — N898 Other specified noninflammatory disorders of vagina: Secondary | ICD-10-CM

## 2023-06-20 ENCOUNTER — Other Ambulatory Visit: Payer: Self-pay | Admitting: Family Medicine

## 2023-06-20 DIAGNOSIS — J0101 Acute recurrent maxillary sinusitis: Secondary | ICD-10-CM

## 2023-07-04 ENCOUNTER — Other Ambulatory Visit: Payer: Self-pay | Admitting: Obstetrics and Gynecology

## 2023-07-04 DIAGNOSIS — N898 Other specified noninflammatory disorders of vagina: Secondary | ICD-10-CM

## 2023-07-05 ENCOUNTER — Other Ambulatory Visit: Payer: Self-pay | Admitting: Obstetrics and Gynecology

## 2023-07-05 ENCOUNTER — Telehealth: Payer: Self-pay

## 2023-07-05 DIAGNOSIS — N951 Menopausal and female climacteric states: Secondary | ICD-10-CM

## 2023-07-05 DIAGNOSIS — Z79890 Hormone replacement therapy (postmenopausal): Secondary | ICD-10-CM

## 2023-07-05 DIAGNOSIS — N898 Other specified noninflammatory disorders of vagina: Secondary | ICD-10-CM

## 2023-07-05 MED ORDER — PROGESTERONE MICRONIZED 100 MG PO CAPS
ORAL_CAPSULE | ORAL | 0 refills | Status: DC
Start: 2023-07-05 — End: 2023-07-19

## 2023-07-05 MED ORDER — CLOTRIMAZOLE-BETAMETHASONE 1-0.05 % EX CREA
TOPICAL_CREAM | CUTANEOUS | 0 refills | Status: AC
Start: 2023-07-05 — End: ?

## 2023-07-05 MED ORDER — ESTRADIOL 0.5 MG PO TABS
0.5000 mg | ORAL_TABLET | Freq: Every day | ORAL | 0 refills | Status: DC
Start: 2023-07-05 — End: 2023-07-19

## 2023-07-05 NOTE — Telephone Encounter (Signed)
Pt calling; needs refill of her hormone medication; today is her last pill; needs refill of clotrimazole-betamethasone cream.  Has appt scheduled 7/30th.  [phone]

## 2023-07-05 NOTE — Progress Notes (Signed)
Rx RF HRT till annual appt

## 2023-07-05 NOTE — Telephone Encounter (Signed)
Rx RFs eRxd.

## 2023-07-18 NOTE — Progress Notes (Unsigned)
PCP: Ronnald Ramp, MD   No chief complaint on file.   HPI:      [name] is a 57 y.o. G2P0202 whose LMP was No LMP recorded. Patient is perimenopausal., presents today for her annual. Her menses are absent due to menopause, no PMB. Started on estradiol 0.5 mg and prometrium 100 mg QHS 12/22. Sx significantly improved.  Sx were hourly hot flashes and night sweats affecting sleep for close to a yr prior to HRT. Already on gabapentin and zoloft without sx relief. LMP ~1/22, no BTB since. Wants to cont HRT. Only gained 1# from 12/22 visit here.    Sex activity: not currently sexually active. Having issues with vaginal itching in 1 spot externally recently. Treated with hydrocortisone crm with some improvement; wants checked. Uses dove sens skin soap, no dryer sheets, wears pantyliners daily. No increased vag d/c, odor.   Last Pap: 06/03/22 Results were: no abnormalities /neg HPV DNA.  Hx of STDs: none  Last mamm[facility]: [facility]: Results were: normal--routine follow-up in 12 months There is no FH of breast cancer. There is no FH of ovarian cancer. The patient does do self-breast exams.  Colonoscopy: not done; pt wanted to wait till 53  Tobacco use: The patient denies current or previous tobacco use. Alcohol use: none No drug use Exercise: not active  She does get adequate calcium and Vitamin D in her diet.  Labs with PCP.  Past Medical History:  Diagnosis Date   Depression    GERD (gastroesophageal reflux disease)    H/O pyloric stenosis    06/24/2015 noted also by Dr. Sullivan Lone   Insomnia    Migraine 06/19/2015    Past Surgical History:  Procedure Laterality Date   BREAST REDUCTION SURGERY     CESAREAN SECTION     x 2   TUBAL LIGATION     UPPER GI ENDOSCOPY     chronic gastritis, LA esophagitis    Family History  Problem Relation Age of Onset   Alzheimer's disease Mother    Diabetes Father    Prostate cancer Father    Diabetes Brother      Social History   Socioeconomic History   Marital status: Single    Spouse name: Not on file   Number of children: Not on file   Years of education: Not on file   Highest education level: Not on file  Occupational History   Not on file  Tobacco Use   Smoking status: Never   Smokeless tobacco: Never  Substance and Sexual Activity   Alcohol use: No    Alcohol/week: 0.0 standard drinks of alcohol   Drug use: No   Sexual activity: Not Currently    Birth control/protection: Surgical  Other Topics Concern   Not on file  Social History Narrative   Not on file   Social Determinants of Health   Financial Resource Strain: Not on file  Food Insecurity: Not on file  Transportation Needs: Not on file  Physical Activity: Not on file  Stress: Not on file  Social Connections: Not on file  Intimate Partner Violence: Not on file    Outpatient Medications Prior to Visit  Medication Sig Dispense Refill   ALPRAZolam (XANAX) 0.5 MG tablet TAKE 1 TABLET BY MOUTH ONCE DAILY *NEED TO SCHEDULE OFFICE VISIT FOR FURTHER REFILLS* 30 tablet 0   azelastine (ASTELIN) 0.1 % nasal spray USE 1 SPRAY IN BOTH NOSTRILS DAILY AS DIRECTED. 30 mL 0   butalbital-acetaminophen-caffeine (  FIORICET, ESGIC) 50-325-40 MG tablet TAKE ONE TO TWO TABLETS EVERY FOUR HOURSAS NEEDED FOR HEADACHE 35 tablet 3   celecoxib (CELEBREX) 200 MG capsule Take 1 capsule (200 mg total) by mouth daily. 20 capsule 0   cetirizine (ZYRTEC) 10 MG tablet TAKE 1 TABLET BY MOUTH ONCE DAILY 90 tablet 0   clotrimazole-betamethasone (LOTRISONE) cream APPLY EXTERNALLY TWICE DAILY AS NEEDED FOR SYMPTOMS FOR UP TO 2 WEEKS 15 g 0   diclofenac (VOLTAREN) 75 MG EC tablet Take 75 mg by mouth 2 (two) times daily.     estradiol (ESTRACE) 0.5 MG tablet Take 1 tablet (0.5 mg total) by mouth daily. 90 tablet 0   fluticasone (FLONASE) 50 MCG/ACT nasal spray TAKE 2 SPRAYS INTO EACH NOSTRIL DAILY 11.1 mL 6   furosemide (LASIX) 20 MG tablet Take by mouth.      gabapentin (NEURONTIN) 600 MG tablet TAKE 1 TABLET BY MOUTH NIGHTLY 90 tablet 0   meloxicam (MOBIC) 15 MG tablet Take 1 tablet (15 mg total) by mouth daily. 30 tablet 0   metoCLOPramide (REGLAN) 5 MG tablet TAKE ONE TABLET BY MOUTH 3 TIMES DAILY BEFORE MEALS 90 tablet 0   Multiple Vitamin (MULTIVITAMIN) tablet Take 1 tablet by mouth daily.     naproxen (NAPROSYN) 375 MG tablet Take 1.5 tablets (562.5 mg total) by mouth 2 (two) times daily as needed. 60 tablet 2   naproxen (NAPROSYN) 500 MG tablet TAKE 1 TABLET BY MOUTH TWICE DAILY WITH A MEAL 180 tablet 0   omeprazole (PRILOSEC) 20 MG capsule TAKE 1 CAPSULE BY MOUTH TWICE DAILY BEFORE MEAL 180 capsule 0   pantoprazole (PROTONIX) 40 MG tablet Take 1 tablet (40 mg total) by mouth 2 (two) times daily. 60 tablet 0   polyethylene glycol powder (QC NATURA-LAX) 17 GM/SCOOP powder TAKE 17 GRAMS EVERY DAY 510 g 6   progesterone (PROMETRIUM) 100 MG capsule Take 1 cap nightly, 6 nights on, 1 night off 90 capsule 0   Rimegepant Sulfate (NURTEC) 75 MG TBDP Take 1 tablet (75 mg total) by mouth daily. 8 tablet 0   sertraline (ZOLOFT) 100 MG tablet TAKE TWO TABLETS (200MG ) BY MOUTH EVERY DAY 60 tablet 0   Sertraline HCl 200 MG CAPS Take 200 mg by mouth daily. 90 capsule 1   sucralfate (CARAFATE) 1 g tablet TAKE ONE TABLET FOUR TIMES DAILY WITH MEALS AND BEDTIME 240 tablet 4   temazepam (RESTORIL) 30 MG capsule Take 1 capsule (30 mg total) by mouth at bedtime as needed. 30 capsule 0   Verapamil HCl CR 200 MG CP24 TAKE 1 CAPSULE BY MOUTH ONCE DAILY 30 capsule 2   No facility-administered medications prior to visit.     ROS:  Review of Systems  Constitutional:  Negative for fatigue, fever and unexpected weight change.  Respiratory:  Negative for cough, shortness of breath and wheezing.   Cardiovascular:  Negative for chest pain, palpitations and leg swelling.  Gastrointestinal:  Negative for blood in stool, constipation, diarrhea, nausea and vomiting.   Endocrine: Negative for cold intolerance, heat intolerance and polyuria.  Genitourinary:  Negative for dyspareunia, dysuria, flank pain, frequency, genital sores, hematuria, menstrual problem, pelvic pain, urgency, vaginal bleeding, vaginal discharge and vaginal pain.  Musculoskeletal:  Negative for back pain, joint swelling and myalgias.  Skin:  Negative for rash.  Neurological:  Negative for dizziness, syncope, light-headedness, numbness and headaches.  Hematological:  Negative for adenopathy.  Psychiatric/Behavioral:  Positive for dysphoric mood. Negative for agitation, confusion, sleep disturbance and suicidal  ideas. The patient is not nervous/anxious.   BREAST: No symptoms   Objective: There were no vitals taken for this visit.   Physical Exam Constitutional:      Appearance: She is well-developed.  Genitourinary:     Vulva normal.     Right Labia: No rash, tenderness or lesions.    Left Labia: No tenderness, lesions or rash.    No vaginal discharge, erythema or tenderness.      Right Adnexa: not tender and no mass present.    Left Adnexa: not tender and no mass present.    No cervical motion tenderness, friability or polyp.     Uterus is not enlarged or tender.  Breasts:    Right: No mass, nipple discharge, skin change or tenderness.     Left: No mass, nipple discharge, skin change or tenderness.  Neck:     Thyroid: No thyromegaly.  Cardiovascular:     Rate and Rhythm: Normal rate and regular rhythm.     Heart sounds: Normal heart sounds. No murmur heard. Pulmonary:     Effort: Pulmonary effort is normal.     Breath sounds: Normal breath sounds.  Abdominal:     Palpations: Abdomen is soft.     Tenderness: There is no abdominal tenderness. There is no guarding or rebound.  Musculoskeletal:        General: Normal range of motion.     Cervical back: Normal range of motion.  Lymphadenopathy:     Cervical: No cervical adenopathy.  Neurological:     General: No focal  deficit present.     Mental Status: She is alert and oriented to person, place, and time.     Cranial Nerves: No cranial nerve deficit.  Skin:    General: Skin is warm and dry.  Psychiatric:        Mood and Affect: Mood normal.        Behavior: Behavior normal.        Thought Content: Thought content normal.        Judgment: Judgment normal.  Vitals reviewed.    No results found for this or any previous visit (from the past 24 hour(s)).   Assessment/Plan:  Encounter for annual routine gynecological examination  Cervical cancer screening - Plan: Cytology - PAP  Screening for HPV (human papillomavirus) - Plan: Cytology - PAP  Encounter for screening mammogram for malignant neoplasm of breast - Plan: MM 3D SCREEN BREAST BILATERAL; pt [facility] schedule [facility]  Screening for colon cancer - Plan: Cologuard; colonoscopy/cologuard discussed. Pt elects cologuard. Ref sent. Will f/u with results.  Vasomotor symptoms due to menopause - Plan: progesterone (PROMETRIUM) 100 MG capsule, estradiol (ESTRACE) 0.5 MG tablet; sx improved, Rx RF. F/u prn.   Hormone replacement therapy (HRT) - Plan: progesterone (PROMETRIUM) 100 MG capsule, estradiol (ESTRACE) 0.5 MG tablet  Vaginal itching - Plan: clotrimazole-betamethasone (LOTRISONE) cream, POCT Wet Prep with KOH; neg exam/neg wet prep; Rx lotrisone crm prn sx. Change pantyliners regularly to keep dry. F/u prn.   No orders of the defined types were placed in this encounter.         GYN counsel breast self exam, mammography screening, menopause, adequate intake of calcium and vitamin D, diet and exercise    F/U  No follow-ups on file.  Tashonna Descoteaux B. Maitlyn Penza, PA-C 07/18/2023 2:18 PM

## 2023-07-19 ENCOUNTER — Other Ambulatory Visit: Payer: Self-pay | Admitting: Obstetrics and Gynecology

## 2023-07-19 ENCOUNTER — Encounter: Payer: Self-pay | Admitting: Obstetrics and Gynecology

## 2023-07-19 ENCOUNTER — Ambulatory Visit: Payer: BC Managed Care – PPO | Admitting: Obstetrics and Gynecology

## 2023-07-19 VITALS — BP 110/80 | Ht 64.0 in | Wt 191.0 lb

## 2023-07-19 DIAGNOSIS — G43711 Chronic migraine without aura, intractable, with status migrainosus: Secondary | ICD-10-CM

## 2023-07-19 DIAGNOSIS — Z1211 Encounter for screening for malignant neoplasm of colon: Secondary | ICD-10-CM

## 2023-07-19 DIAGNOSIS — N898 Other specified noninflammatory disorders of vagina: Secondary | ICD-10-CM

## 2023-07-19 DIAGNOSIS — Z79890 Hormone replacement therapy (postmenopausal): Secondary | ICD-10-CM

## 2023-07-19 DIAGNOSIS — Z01419 Encounter for gynecological examination (general) (routine) without abnormal findings: Secondary | ICD-10-CM | POA: Diagnosis not present

## 2023-07-19 DIAGNOSIS — G43811 Other migraine, intractable, with status migrainosus: Secondary | ICD-10-CM

## 2023-07-19 DIAGNOSIS — Z1231 Encounter for screening mammogram for malignant neoplasm of breast: Secondary | ICD-10-CM

## 2023-07-19 DIAGNOSIS — N951 Menopausal and female climacteric states: Secondary | ICD-10-CM

## 2023-07-19 MED ORDER — ESTRADIOL 0.5 MG PO TABS: 0.5000 mg | ORAL_TABLET | Freq: Every day | ORAL | 3 refills | Status: DC

## 2023-07-19 MED ORDER — PROGESTERONE MICRONIZED 100 MG PO CAPS
ORAL_CAPSULE | ORAL | 3 refills | Status: DC
Start: 2023-07-19 — End: 2024-06-07

## 2023-07-19 NOTE — Patient Instructions (Addendum)
I value your feedback and you entrusting us with your care. If you get a [facility] patient survey, I would appreciate you taking the time to let us know about your experience today. Thank you!  [facility]: [phone]   [facility]: [phone]    

## 2023-08-02 ENCOUNTER — Other Ambulatory Visit: Payer: Self-pay | Admitting: Family Medicine

## 2023-08-02 DIAGNOSIS — G43119 Migraine with aura, intractable, without status migrainosus: Secondary | ICD-10-CM

## 2023-08-17 ENCOUNTER — Other Ambulatory Visit: Payer: Self-pay | Admitting: Family Medicine

## 2023-08-30 ENCOUNTER — Other Ambulatory Visit: Payer: Self-pay | Admitting: Family Medicine

## 2023-08-31 NOTE — Telephone Encounter (Signed)
Requested medication (s) are due for refill today: yes  Requested medication (s) are on the active medication list: yes  Last refill:  05/31/23 #90  Future visit scheduled: no  Notes to clinic:  need OV- Sent pt MyChart message to call office and make appt with PCP   Requested Prescriptions  Pending Prescriptions Disp Refills   metoCLOPramide (REGLAN) 5 MG tablet [Pharmacy Med Name: METOCLOPRAMIDE HCL 5 MG TAB] 90 tablet 0    Sig: TAKE ONE TABLET BY MOUTH 3 TIMES DAILY BEFORE MEALS     Not Delegated - Gastroenterology: Antiemetics - metoclopramide Failed - 08/30/2023 10:30 AM      Failed - This refill cannot be delegated      Failed - Cr in normal range and within 360 days    Creatinine, Ser  Date Value Ref Range Status  06/03/2022 0.63 0.57 - 1.00 mg/dL Final         Failed - Valid encounter within last 6 months    Recent Outpatient Visits           6 months ago Acute non-recurrent frontal sinusitis   [place] [facility] Alfredia Ferguson, PA-C   7 months ago Pain localized to upper abdomen   [facility] [place], [place], PA-C   8 months ago Recurrent sinusitis   [place] [facility] [place], [place], PA-C   9 months ago Chronic frontal sinusitis   [facility] Malva Limes, MD   1 year ago Annual physical exam   [facility] Bosie Clos, MD

## 2023-10-04 ENCOUNTER — Other Ambulatory Visit: Payer: Self-pay | Admitting: Family Medicine

## 2023-10-04 DIAGNOSIS — F419 Anxiety disorder, unspecified: Secondary | ICD-10-CM

## 2023-10-05 NOTE — Telephone Encounter (Signed)
Requested medication (s) are due for refill today: Yes  Requested medication (s) are on the active medication list: Yes  Last refill:  05/02/23  Future visit scheduled: Yes  Notes to clinic:  Not delegated.    Requested Prescriptions  Pending Prescriptions Disp Refills   ALPRAZolam (XANAX) 0.5 MG tablet [Pharmacy Med Name: ALPRAZOLAM 0.5 MG TAB] 30 tablet     Sig: TAKE 1 TABLET BY MOUTH ONCE DAILY *NEED TO SCHEDULE OFFICE VISIT FOR FURTHER REFILLS*     Not Delegated - Psychiatry: Anxiolytics/Hypnotics 2 Failed - 10/04/2023  6:09 PM      Failed - This refill cannot be delegated      Failed - Urine Drug Screen completed in last 360 days      Failed - Valid encounter within last 6 months    Recent Outpatient Visits           7 months ago Acute non-recurrent frontal sinusitis   [place] [facility] Alfredia Ferguson, PA-C   9 months ago Pain localized to upper abdomen   [facility] [place], [place], [place]   10 months ago Recurrent sinusitis   [place] [facility] [facility] Alfredia Ferguson, PA-C   11 months ago Chronic frontal sinusitis   [place] [facility] Malva Limes, MD   1 year ago Annual physical exam   [facility] Bosie Clos, MD              Passed - Patient is not pregnant

## 2023-11-24 ENCOUNTER — Other Ambulatory Visit: Payer: Self-pay | Admitting: Physician Assistant

## 2023-11-24 DIAGNOSIS — R1010 Upper abdominal pain, unspecified: Secondary | ICD-10-CM

## 2023-11-25 NOTE — Telephone Encounter (Signed)
Requested Prescriptions  Pending Prescriptions Disp Refills   pantoprazole (PROTONIX) 40 MG tablet [Pharmacy Med Name: PANTOPRAZOLE SODIUM 40 MG DR TAB] 60 tablet 0    Sig: TAKE ONE TABLET BY MOUTH TWICE DAILY     Gastroenterology: Proton Pump Inhibitors Passed - 11/24/2023 11:14 AM      Passed - Valid encounter within last 12 months    Recent Outpatient Visits           8 months ago Acute non-recurrent frontal sinusitis   [place] [facility] Alfred[facility] Ferguson, PA-C   10 months ago Pain localized to upper abdomen   [facility] [place], [place], [place]   11 months ago Recurrent sinusitis   [facility] Alfredia Ferguson, PA-C   1 year ago Chronic frontal sinusitis   [place] [facility] Malva Limes, MD   1 year ago Annual physical exam   [facility] Bosie Clos, MD

## 2023-11-28 ENCOUNTER — Other Ambulatory Visit: Payer: Self-pay | Admitting: Family Medicine

## 2023-11-28 DIAGNOSIS — G43119 Migraine with aura, intractable, without status migrainosus: Secondary | ICD-10-CM

## 2023-12-29 ENCOUNTER — Ambulatory Visit: Payer: BC Managed Care – PPO | Admitting: Family Medicine

## 2024-01-02 ENCOUNTER — Other Ambulatory Visit: Payer: Self-pay | Admitting: Family Medicine

## 2024-01-02 DIAGNOSIS — G43119 Migraine with aura, intractable, without status migrainosus: Secondary | ICD-10-CM

## 2024-01-18 ENCOUNTER — Telehealth: Payer: 59 | Admitting: Family Medicine

## 2024-01-18 ENCOUNTER — Ambulatory Visit: Payer: BC Managed Care – PPO | Admitting: Family Medicine

## 2024-01-18 ENCOUNTER — Encounter: Payer: Self-pay | Admitting: Family Medicine

## 2024-01-18 VITALS — Ht 64.0 in | Wt 189.0 lb

## 2024-01-18 DIAGNOSIS — E669 Obesity, unspecified: Secondary | ICD-10-CM

## 2024-01-18 DIAGNOSIS — S39012A Strain of muscle, fascia and tendon of lower back, initial encounter: Secondary | ICD-10-CM | POA: Diagnosis not present

## 2024-01-18 DIAGNOSIS — Z6832 Body mass index (BMI) 32.0-32.9, adult: Secondary | ICD-10-CM | POA: Diagnosis not present

## 2024-01-18 MED ORDER — CYCLOBENZAPRINE HCL 5 MG PO TABS
5.0000 mg | ORAL_TABLET | Freq: Every day | ORAL | 0 refills | Status: DC
Start: 2024-01-18 — End: 2024-01-18

## 2024-01-18 MED ORDER — BACLOFEN 10 MG PO TABS: 10.0000 mg | ORAL_TABLET | Freq: Three times a day (TID) | ORAL | 0 refills | Status: AC

## 2024-01-18 MED ORDER — CYCLOBENZAPRINE HCL 5 MG PO TABS: 5.0000 mg | ORAL_TABLET | Freq: Every day | ORAL | 0 refills | Status: DC

## 2024-01-18 MED ORDER — MELOXICAM 15 MG PO TABS
15.0000 mg | ORAL_TABLET | Freq: Every day | ORAL | 0 refills | Status: AC
Start: 2024-01-18 — End: ?

## 2024-01-18 NOTE — Addendum Note (Signed)
Addended by: Bing Neighbors on: 01/18/2024 04:42 PM   Modules accepted: Orders

## 2024-01-18 NOTE — Progress Notes (Signed)
MyChart Video Visit    Virtual Visit via Video Note   This format is felt to be most appropriate for this patient at this time. Physical exam was limited by quality of the video and audio technology used for the visit.   Patient location: Patient's home address   Provider location: [facility]  [address]  [place], [place] [address]   I discussed the limitations of evaluation and management by telemedicine and the availability of in person appointments. The patient expressed understanding and agreed to proceed.  Patient: Elizabeth Munoz   DOB: 1966-10-08   58 y.o. Female  MRN: [id number] Visit Date: 01/18/2024  Today's healthcare provider: Ronnald Ramp, MD   No chief complaint on file.  Subjective    HPI   Discussed the use of AI scribe software for clinical note transcription with the patient, who gave verbal consent to proceed.  History of Present Illness   The patient is a 58 year old female who presents with back pain and medication refill.  She has been experiencing severe lower back pain for the last three to four months. The pain is not centralized and can occur on either side or in the middle of the lower back. 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 She has been using gabapentin 600 mg at bedtime for leg pain, but it has not been effective for her back pain. She has also tried Tylenol and heat pads without relief. No nodules or knots are felt in her back.  She is seeking a refill for her migraine medication. Although she has a history of migraines, details of her migraine symptoms or current medication regimen were not discussed during this visit.  She is concerned about her weight, which she attributes to starting premenopausal treatment with Estrace 0.5 mg for hot flashes. She reports a weight of 189 pounds and a height of 5'4", noting  difficulty managing her weight since starting the medication. She has a history of breast reduction surgery and is worried that her weight gain is affecting her back pain and breast size.  Her husband, Sena Hitch, recently had a heart attack and was hospitalized. She believes this may have contributed to her exhaustion and possibly her back pain. Despite her current pain, she plans to return to work, as she has been absent due to her husband's health issues.          Past Medical History:  Diagnosis Date   Depression    GERD (gastroesophageal reflux disease)    H/O pyloric stenosis    06/24/2015 noted also by Dr. Sullivan Lone   Insomnia    Migraine 06/19/2015    Medications: Outpatient Medications Prior to Visit  Medication Sig   ALPRAZolam (XANAX) 0.5 MG tablet TAKE 1 TABLET BY MOUTH ONCE DAILY *NEED TO SCHEDULE OFFICE VISIT FOR FURTHER REFILLS*   azelastine (ASTELIN) 0.1 % nasal spray USE 1 SPRAY IN BOTH NOSTRILS DAILY AS DIRECTED.   butalbital-acetaminophen-caffeine (FIORICET, ESGIC) 50-325-40 MG tablet TAKE ONE TO TWO TABLETS EVERY FOUR HOURSAS NEEDED FOR HEADACHE   cetirizine (ZYRTEC) 10 MG tablet TAKE 1 TABLET BY MOUTH ONCE DAILY   clotrimazole-betamethasone (LOTRISONE) cream APPLY EXTERNALLY TWICE DAILY AS NEEDED FOR SYMPTOMS FOR UP TO 2 WEEKS   estradiol (ESTRACE) 0.5 MG tablet Take 1 tablet (0.5 mg total) by mouth daily.   fluticasone (FLONASE) 50 MCG/ACT nasal spray TAKE 2 SPRAYS INTO [facility]  NOSTRIL DAILY   furosemide (LASIX) 20 MG tablet Take by mouth.   gabapentin (NEURONTIN) 600 MG tablet TAKE 1 TABLET BY MOUTH NIGHTLY   metoCLOPramide (REGLAN) 5 MG tablet TAKE ONE TABLET BY MOUTH 3 TIMES DAILY BEFORE MEALS   Multiple Vitamin (MULTIVITAMIN) tablet Take 1 tablet by mouth daily.   NURTEC 75 MG TBDP TAKE ONE TABLET BY MOUTH EVERY DAY   omeprazole (PRILOSEC) 20 MG capsule TAKE 1 CAPSULE BY MOUTH TWICE DAILY BEFORE MEAL   pantoprazole (PROTONIX) 40 MG tablet TAKE ONE TABLET BY  MOUTH TWICE DAILY   polyethylene glycol powder (QC NATURA-LAX) 17 GM/SCOOP powder TAKE 17 GRAMS EVERY DAY   progesterone (PROMETRIUM) 100 MG capsule Take 1 cap nightly, 6 nights on, 1 night off   sertraline (ZOLOFT) 100 MG tablet TAKE TWO TABLETS (200MG ) BY MOUTH EVERY DAY   Sertraline HCl 200 MG CAPS Take 200 mg by mouth daily.   sucralfate (CARAFATE) 1 g tablet TAKE ONE TABLET FOUR TIMES DAILY WITH MEALS AND BEDTIME   temazepam (RESTORIL) 30 MG capsule Take 1 capsule (30 mg total) by mouth at bedtime as needed.   Verapamil HCl CR 200 MG CP24 TAKE 1 CAPSULE BY MOUTH ONCE DAILY   [DISCONTINUED] celecoxib (CELEBREX) 200 MG capsule Take 1 capsule (200 mg total) by mouth daily.   [DISCONTINUED] diclofenac (VOLTAREN) 75 MG EC tablet Take 75 mg by mouth 2 (two) times daily.   [DISCONTINUED] meloxicam (MOBIC) 15 MG tablet Take 1 tablet (15 mg total) by mouth daily.   [DISCONTINUED] naproxen (NAPROSYN) 500 MG tablet TAKE 1 TABLET BY MOUTH TWICE DAILY WITH A MEAL   No facility-administered medications prior to visit.    Review of Systems  Last metabolic panel Lab Results  Component Value Date   GLUCOSE 85 06/03/2022   NA 139 06/03/2022   K 4.5 06/03/2022   CL 102 06/03/2022   CO2 23 06/03/2022   BUN 19 06/03/2022   CREATININE 0.63 06/03/2022   EGFR 103 06/03/2022   CALCIUM 9.9 06/03/2022   PROT 7.6 06/03/2022   ALBUMIN 4.7 06/03/2022   LABGLOB 2.9 06/03/2022   AGRATIO 1.6 06/03/2022   BILITOT 0.2 06/03/2022   ALKPHOS 103 06/03/2022   AST 16 06/03/2022   ALT 14 06/03/2022   Last lipids Lab Results  Component Value Date   CHOL 247 (H) 06/03/2022   HDL 53 06/03/2022   LDLCALC 163 (H) 06/03/2022   TRIG 172 (H) 06/03/2022   CHOLHDL 4.7 (H) 06/03/2022   Last hemoglobin A1c No results found for: "HGBA1C" Last thyroid functions Lab Results  Component Value Date   TSH 1.640 06/03/2022        Objective    Ht 5\' 4"  (1.626 m)   Wt 189 lb (85.7 kg)   BMI 32.44 kg/m   BP  Readings from Last 3 Encounters:  07/19/23 110/80  06/03/22 118/70  03/30/22 112/80   Wt Readings from Last 3 Encounters:  01/18/24 189 lb (85.7 kg)  07/19/23 191 lb (86.6 kg)  06/03/22 183 lb (83 kg)        Physical Exam Musculoskeletal:     Comments: Pt reports lower back tenderness         Assessment & Plan     Problem List Items Addressed This Visit   None Visit Diagnoses       Strain of lumbar region, initial encounter    -  Primary   Relevant Medications   meloxicam (MOBIC) 15 MG tablet  cyclobenzaprine (FLEXERIL) 5 MG tablet            Chronic Low Back Pain   Chronic low back pain for the past 3-4 months, exacerbated recently. Gabapentin 600 mg at bedtime has been ineffective. Tried Tylenol and heat pads with minimal relief. Pain may be musculoskeletal. Discussed risks and benefits of meloxicam and Flexeril. Advised against concurrent NSAIDs with meloxicam. Potential need for imaging and physical therapy if no improvement.   - Prescribe meloxicam 15 mg once daily   - Prescribe Flexeril 5 mg at bedtime   - Continue using heat pads   - Advise gentle stretching exercises   - Consider imaging if no improvement   - Consider referral to physical therapy if pain persists    Obesity   BMI of 32, weight gain potentially related to premenopausal hormone therapy (Estrace 0.5 mg). Reports difficulty with physical activity and concerns about weight impacting daily function. Discussed potential use of Wegovy or Zepbound for weight management. No contraindications such as medullary thyroid cancer or pancreatitis. Advised to check with insurance for coverage. Explained medication costs without coverage, ranging from $1200 to $2000 per month.   - Discuss with insurance about coverage for Wegovy or Zepbound   - Follow up on weight management plan at next appointment     Follow-up   - Follow up on February 06, 2024   - Discuss weight management plan and medication  renewals at follow-up.         No follow-ups on file.     I discussed the assessment and treatment plan with the patient. The patient was provided an opportunity to ask questions and all were answered. The patient agreed with the plan and demonstrated an understanding of the instructions.   The patient was advised to call back or seek an in-person evaluation if the symptoms worsen or if the condition fails to improve as anticipated.  I provided 20 minutes of non-face-to-face time during this encounter.   Ronnald Ramp, MD [facility] [phone] (phone) [phone] (fax)  [facility]

## 2024-01-18 NOTE — Patient Instructions (Addendum)
Would either Zepbound or Wegovy be covered for weight loss - please ask insurance company   VISIT SUMMARY:  Today, we discussed your ongoing back pain, migraine medication refill, weight concerns, and general health maintenance. We have made some changes to your treatment plan to help manage your symptoms and improve your overall health.  YOUR PLAN:   -CHRONIC LOW BACK PAIN: Chronic low back pain is persistent pain in the lower back that can affect daily activities. We have prescribed meloxicam 15 mg once daily and Flexeril 5 mg at bedtime to help manage your pain. Continue using heat pads and try gentle stretching exercises. If there is no improvement, we may consider imaging or a referral to physical therapy.  -OBESITY: Obesity is a condition where excess body fat affects health. Your weight gain may be related to premenopausal hormone therapy. We discussed the potential use of Wegovy or Zepbound for weight management. Please check with your insurance for coverage, as these medications can be expensive without it. We will follow up on your weight management plan at your next appointment.  Please follow up on February 06, 2024, to discuss your weight management plan and medication renewals.

## 2024-01-18 NOTE — Addendum Note (Signed)
Addended by: Bing Neighbors on: 01/18/2024 04:31 PM   Modules accepted: Orders

## 2024-01-31 ENCOUNTER — Telehealth (INDEPENDENT_AMBULATORY_CARE_PROVIDER_SITE_OTHER): Payer: 59 | Admitting: Family Medicine

## 2024-01-31 ENCOUNTER — Encounter: Payer: Self-pay | Admitting: Family Medicine

## 2024-01-31 DIAGNOSIS — J0101 Acute recurrent maxillary sinusitis: Secondary | ICD-10-CM

## 2024-01-31 MED ORDER — AMOXICILLIN 500 MG PO CAPS: 1000.0000 mg | ORAL_CAPSULE | Freq: Two times a day (BID) | ORAL | 0 refills | Status: AC

## 2024-01-31 MED ORDER — BENZONATATE 200 MG PO CAPS: 200.0000 mg | ORAL_CAPSULE | Freq: Two times a day (BID) | ORAL | 0 refills | Status: DC | PRN

## 2024-01-31 NOTE — Patient Instructions (Signed)
Marland Kitchen  Please review the attached list of medications and notify my office if there are any errors.   . Please bring all of your medications to every appointment so we can make sure that our medication list is the same as yours.

## 2024-01-31 NOTE — Progress Notes (Signed)
MyChart Video Visit    Virtual Visit via Video Note   This format is felt to be most appropriate for this patient at this time. Physical exam was limited by quality of the video and audio technology used for the visit.   Patient location: Home Provider location: [facility]  I discussed the limitations of evaluation and management by telemedicine and the availability of in person appointments. The patient expressed understanding and agreed to proceed.  Patient: Elizabeth Munoz   DOB: March 12, 1966   58 y.o. Female  MRN: [id number] Visit Date: 01/31/2024  Today's healthcare provider: Mila Merry, MD   Chief Complaint  Patient presents with   URI    X 1 week coughing, sinus pressure and coughing green and yellow. Patient has tried these medications  Mucimex, delsym. .   Subjective    Discussed the use of AI scribe software for clinical note transcription with the patient, who gave verbal consent to proceed.  History of Present Illness   Elizabeth Munoz is a 58 year old female who presents with sinus pressure and a persistent cough.  She has been experiencing sinus pressure for the past week, primarily located under her eyes. She has been using Flonase daily to manage her symptoms.  She began experiencing a severe cough on Sunday, which is productive of green phlegm. 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 No fever, chills, sweats, wheezing, or shortness of breath, although she feels tired after a day of work.  She has been using Delsym since Sunday for the cough, but feels it is not effective anymore. She completed a full package of Mucinex for sinus pressure relief, but the cough remains her primary concern.  She recalls having a similar episode about a year ago, for which she was prescribed amoxicillin, which she believes was effective.       Medications: Outpatient  Medications Prior to Visit  Medication Sig   ALPRAZolam (XANAX) 0.5 MG tablet TAKE 1 TABLET BY MOUTH ONCE DAILY *NEED TO SCHEDULE OFFICE VISIT FOR FURTHER REFILLS*   azelastine (ASTELIN) 0.1 % nasal spray USE 1 SPRAY IN BOTH NOSTRILS DAILY AS DIRECTED.   baclofen (LIORESAL) 10 MG tablet Take 1 tablet (10 mg total) by mouth 3 (three) times daily.   butalbital-acetaminophen-caffeine (FIORICET, ESGIC) 50-325-40 MG tablet TAKE ONE TO TWO TABLETS EVERY FOUR HOURSAS NEEDED FOR HEADACHE   cetirizine (ZYRTEC) 10 MG tablet TAKE 1 TABLET BY MOUTH ONCE DAILY   clotrimazole-betamethasone (LOTRISONE) cream APPLY EXTERNALLY TWICE DAILY AS NEEDED FOR SYMPTOMS FOR UP TO 2 WEEKS   estradiol (ESTRACE) 0.5 MG tablet Take 1 tablet (0.5 mg total) by mouth daily.   fluticasone (FLONASE) 50 MCG/ACT nasal spray TAKE 2 SPRAYS INTO EACH NOSTRIL DAILY   furosemide (LASIX) 20 MG tablet Take by mouth.   gabapentin (NEURONTIN) 600 MG tablet TAKE 1 TABLET BY MOUTH NIGHTLY   meloxicam (MOBIC) 15 MG tablet Take 1 tablet (15 mg total) by mouth daily.   metoCLOPramide (REGLAN) 5 MG tablet TAKE ONE TABLET BY MOUTH 3 TIMES DAILY BEFORE MEALS   Multiple Vitamin (MULTIVITAMIN) tablet Take 1 tablet by mouth daily.   NURTEC 75 MG TBDP TAKE ONE TABLET BY MOUTH EVERY DAY   omeprazole (PRILOSEC) 20 MG capsule TAKE 1 CAPSULE BY MOUTH TWICE DAILY BEFORE MEAL   pantoprazole (PROTONIX) 40 MG tablet TAKE ONE TABLET BY MOUTH TWICE DAILY   polyethylene  glycol powder (QC NATURA-LAX) 17 GM/SCOOP powder TAKE 17 GRAMS EVERY DAY   progesterone (PROMETRIUM) 100 MG capsule Take 1 cap nightly, 6 nights on, 1 night off   sertraline (ZOLOFT) 100 MG tablet TAKE TWO TABLETS (200MG ) BY MOUTH EVERY DAY   Sertraline HCl 200 MG CAPS Take 200 mg by mouth daily.   sucralfate (CARAFATE) 1 g tablet TAKE ONE TABLET FOUR TIMES DAILY WITH MEALS AND BEDTIME   temazepam (RESTORIL) 30 MG capsule Take 1 capsule (30 mg total) by mouth at bedtime as needed.   Verapamil  HCl CR 200 MG CP24 TAKE 1 CAPSULE BY MOUTH ONCE DAILY   No facility-administered medications prior to visit.    Review of Systems     Objective    There were no vitals taken for this visit.    Physical Exam  General appearance: Mildly obese female, cooperative and in no acute distress Head: Normocephalic, without obvious abnormality, atraumatic Respiratory: Respirations even and unlabored, normal respiratory rate Extremities: All extremities are intact.  Skin: Skin color, texture, turgor normal. No rashes seen  Psych: Appropriate mood and affect. Neurologic: Mental status: Alert, oriented to person, place, and time, thought content appropriate.      Assessment & Plan       Sinusitis Sinus pressure under eyes for a week, productive cough with green phlegm for 2 days. No fever, chills, sweats, wheezing, or shortness of breath. Previously treated with amoxicillin. -Prescribe amoxicillin. -Continue Flonase daily.  Cough Severe cough with phlegm production, impacting daily activities. Over the counter Delsym and Mucinex not providing sufficient relief. -Prescribe Tessalon to be taken in combination with Mucinex. -Provide work excuse for today's absence.        I discussed the assessment and treatment plan with the patient. The patient was provided an opportunity to ask questions and all were answered. The patient agreed with the plan and demonstrated an understanding of the instructions.   The patient was advised to call back or seek an in-person evaluation if the symptoms worsen or if the condition fails to improve as anticipated.  I provided 9 minutes of non-face-to-face time during this encounter.   Mila Merry, MD [facility] [phone] (phone) [phone] (fax)  [facility]

## 2024-02-01 ENCOUNTER — Other Ambulatory Visit: Payer: Self-pay | Admitting: Family Medicine

## 2024-02-01 DIAGNOSIS — G43119 Migraine with aura, intractable, without status migrainosus: Secondary | ICD-10-CM

## 2024-02-02 NOTE — Telephone Encounter (Signed)
Requested Prescriptions  Pending Prescriptions Disp Refills   gabapentin (NEURONTIN) 600 MG tablet [Pharmacy Med Name: GABAPENTIN 600 MG TAB] 90 tablet 0    Sig: TAKE 1 TABLET BY MOUTH NIGHTLY     Neurology: Anticonvulsants - gabapentin Failed - 02/02/2024  9:14 AM      Failed - Cr in normal range and within 360 days    Creatinine, Ser  Date Value Ref Range Status  06/03/2022 0.63 0.57 - 1.00 mg/dL Final         Failed - Completed PHQ-2 or PHQ-9 in the last 360 days      Passed - Valid encounter within last 12 months    Recent Outpatient Visits           2 weeks ago Strain of lumbar region, initial encounter   [place] [facility] [place], [place], MD   11 months ago Acute non-recurrent frontal sinusitis   [place] [facility] Alfredia Ferguson, PA-C   1 year ago Pain localized to upper abdomen   [place] [facility] [place], PA-C   1 year ago Recurrent sinusitis   [place] [facility] Alfredia Ferguson, PA-C   1 year ago Chronic frontal sinusitis   [place] [facility] Malva Limes, MD       Future Appointments             In 4 days Simmons-Robinson, Tawanna Cooler, MD [facility]

## 2024-02-03 ENCOUNTER — Telehealth: Payer: Self-pay

## 2024-02-03 NOTE — Telephone Encounter (Signed)
Copied from CRM [id number]. Topic: General - Other >> Feb 01, 2024  3:01 PM Santiya F wrote: Reason for CRM: Pt is calling in requesting a letter excusing her from work for her appointment she had yesterday. Pt says she forgot to get the note and will need it for work. Pt says it is okay to send it through Mychart. Pt is also requesting that today be included in the note as well due to her still experiencing the symptoms from yesterday.

## 2024-02-03 NOTE — Telephone Encounter (Signed)
Call patient excuse letter ready for 02/11 for pick up. Per patient not feeling well. Feeling worse and would like to be excuse from work from 02/11 to today. Patient requesting appointment to be seen feels that she is wheezing. Placed patient on hold to look for an appointment with [facility] and patient hung up.

## 2024-02-06 ENCOUNTER — Encounter: Payer: Self-pay | Admitting: Family Medicine

## 2024-02-06 ENCOUNTER — Ambulatory Visit: Payer: 59 | Admitting: Family Medicine

## 2024-02-06 VITALS — BP 127/82 | HR 102 | Temp 98.7°F | Resp 20 | Ht 64.0 in | Wt 190.0 lb

## 2024-02-06 DIAGNOSIS — Z6832 Body mass index (BMI) 32.0-32.9, adult: Secondary | ICD-10-CM

## 2024-02-06 DIAGNOSIS — F39 Unspecified mood [affective] disorder: Secondary | ICD-10-CM | POA: Diagnosis not present

## 2024-02-06 DIAGNOSIS — F5104 Psychophysiologic insomnia: Secondary | ICD-10-CM | POA: Diagnosis not present

## 2024-02-06 DIAGNOSIS — J40 Bronchitis, not specified as acute or chronic: Secondary | ICD-10-CM | POA: Diagnosis not present

## 2024-02-06 DIAGNOSIS — E66811 Obesity, class 1: Secondary | ICD-10-CM | POA: Diagnosis not present

## 2024-02-06 MED ORDER — SEMAGLUTIDE-WEIGHT MANAGEMENT 0.5 MG/0.5ML ~~LOC~~ SOAJ
0.5000 mg | SUBCUTANEOUS | 0 refills | Status: AC
Start: 2024-03-05 — End: ?

## 2024-02-06 MED ORDER — ALBUTEROL SULFATE HFA 108 (90 BASE) MCG/ACT IN AERS: 2.0000 | INHALATION_SPRAY | Freq: Four times a day (QID) | RESPIRATORY_TRACT | 2 refills | Status: AC | PRN

## 2024-02-06 MED ORDER — SEMAGLUTIDE-WEIGHT MANAGEMENT 0.25 MG/0.5ML ~~LOC~~ SOAJ: 0.2500 mg | SUBCUTANEOUS | 0 refills | Status: AC

## 2024-02-06 MED ORDER — TEMAZEPAM 30 MG PO CAPS: 30.0000 mg | ORAL_CAPSULE | Freq: Every evening | ORAL | 0 refills | Status: DC | PRN

## 2024-02-06 MED ORDER — PREDNISONE 20 MG PO TABS
40.0000 mg | ORAL_TABLET | Freq: Every day | ORAL | 0 refills | Status: AC
Start: 2024-02-06 — End: 2024-02-11

## 2024-02-06 NOTE — Progress Notes (Signed)
 Established patient visit   Patient: Elizabeth Munoz   DOB: 26-May-1966   58 y.o. Female  MRN: [id number] Visit Date: 02/06/2024  Today's healthcare provider: Ronnald Ramp, MD   Chief Complaint  Patient presents with   Weight Loss   Cough    From recent URI continues; patient believes she has pneumonia   Subjective     HPI     Cough    Additional comments: From recent URI continues; patient believes she has pneumonia      Last edited by Ashok Cordia, CMA on 02/06/2024  4:26 PM.       Discussed the use of AI scribe software for clinical note transcription with the patient, who gave verbal consent to proceed.  History of Present Illness   Elizabeth Munoz is a 58 year old female who presents with weight loss and persistent cough.  She has been experiencing a persistent cough for approximately one week, which began after returning home from work where many children were sick. The cough worsened by Tuesday, accompanied by significant nasal congestion and production of phlegm from her chest, which she described as 'disgusting' and not yellow. She experiences shortness of breath and fatigue, particularly when climbing stairs. Two COVID-19 tests were negative. She was prescribed amoxicillin, Entesla, and Perles for the cough, which have helped but not completely resolved her symptoms. There is some improvement in her cough and wheezing over the past few days, with a reduction in frequency of coughing episodes. She has not returned to work due to her symptoms and was considering going back today but decided against it due to persistent symptoms. She describes a 'sound in my chest' and ongoing wheezing, which she finds concerning. She has been drinking water but has not been eating much, which she attributes to the need to take antibiotics with food. No fever, with a temperature of 98.63F, and oxygen saturation is 97%.  Regarding her weight loss, she is interested  in medication options and has discussed insurance coverage for medications like Wegovy. She has a BMI of 32.6 and no personal or family history of medullary thyroid cancer. No sleep apnea or use of a CPAP machine.         Past Medical History:  Diagnosis Date   Depression    GERD (gastroesophageal reflux disease)    H/O pyloric stenosis    06/24/2015 noted also by Dr. Sullivan Lone   Insomnia    Migraine 06/19/2015   Perimenopause 05/29/2018   PMS (premenstrual syndrome) 06/22/2015    Medications: Outpatient Medications Prior to Visit  Medication Sig   ALPRAZolam (XANAX) 0.5 MG tablet TAKE 1 TABLET BY MOUTH ONCE DAILY *NEED TO SCHEDULE OFFICE VISIT FOR FURTHER REFILLS*   amoxicillin (AMOXIL) 500 MG capsule Take 2 capsules (1,000 mg total) by mouth 2 (two) times daily for 7 days.   azelastine (ASTELIN) 0.1 % nasal spray USE 1 SPRAY IN BOTH NOSTRILS DAILY AS DIRECTED.   baclofen (LIORESAL) 10 MG tablet Take 1 tablet (10 mg total) by mouth 3 (three) times daily.   benzonatate (TESSALON) 200 MG capsule Take 1 capsule (200 mg total) by mouth 2 (two) times daily as needed for cough.   butalbital-acetaminophen-caffeine (FIORICET, ESGIC) 50-325-40 MG tablet TAKE ONE TO TWO TABLETS EVERY FOUR HOURSAS NEEDED FOR HEADACHE   cetirizine (ZYRTEC) 10 MG tablet TAKE 1 TABLET BY MOUTH ONCE DAILY   clotrimazole-betamethasone (LOTRISONE) cream APPLY EXTERNALLY TWICE DAILY AS NEEDED FOR SYMPTOMS FOR UP TO 2  WEEKS   estradiol (ESTRACE) 0.5 MG tablet Take 1 tablet (0.5 mg total) by mouth daily.   fluticasone (FLONASE) 50 MCG/ACT nasal spray TAKE 2 SPRAYS INTO EACH NOSTRIL DAILY   furosemide (LASIX) 20 MG tablet Take by mouth.   gabapentin (NEURONTIN) 600 MG tablet TAKE 1 TABLET BY MOUTH NIGHTLY   meloxicam (MOBIC) 15 MG tablet Take 1 tablet (15 mg total) by mouth daily.   metoCLOPramide (REGLAN) 5 MG tablet TAKE ONE TABLET BY MOUTH 3 TIMES DAILY BEFORE MEALS   Multiple Vitamin (MULTIVITAMIN) tablet Take 1  tablet by mouth daily.   NURTEC 75 MG TBDP TAKE ONE TABLET BY MOUTH EVERY DAY   omeprazole (PRILOSEC) 20 MG capsule TAKE 1 CAPSULE BY MOUTH TWICE DAILY BEFORE MEAL   pantoprazole (PROTONIX) 40 MG tablet TAKE ONE TABLET BY MOUTH TWICE DAILY   polyethylene glycol powder (QC NATURA-LAX) 17 GM/SCOOP powder TAKE 17 GRAMS EVERY DAY   progesterone (PROMETRIUM) 100 MG capsule Take 1 cap nightly, 6 nights on, 1 night off   sertraline (ZOLOFT) 100 MG tablet TAKE TWO TABLETS (200MG ) BY MOUTH EVERY DAY   Sertraline HCl 200 MG CAPS Take 200 mg by mouth daily.   sucralfate (CARAFATE) 1 g tablet TAKE ONE TABLET FOUR TIMES DAILY WITH MEALS AND BEDTIME   Verapamil HCl CR 200 MG CP24 TAKE 1 CAPSULE BY MOUTH ONCE DAILY   [DISCONTINUED] temazepam (RESTORIL) 30 MG capsule Take 1 capsule (30 mg total) by mouth at bedtime as needed.   No facility-administered medications prior to visit.    Review of Systems  Last metabolic panel Lab Results  Component Value Date   GLUCOSE 85 06/03/2022   NA 139 06/03/2022   K 4.5 06/03/2022   CL 102 06/03/2022   CO2 23 06/03/2022   BUN 19 06/03/2022   CREATININE 0.63 06/03/2022   EGFR 103 06/03/2022   CALCIUM 9.9 06/03/2022   PROT 7.6 06/03/2022   ALBUMIN 4.7 06/03/2022   LABGLOB 2.9 06/03/2022   AGRATIO 1.6 06/03/2022   BILITOT 0.2 06/03/2022   ALKPHOS 103 06/03/2022   AST 16 06/03/2022   ALT 14 06/03/2022   Last lipids Lab Results  Component Value Date   CHOL 247 (H) 06/03/2022   HDL 53 06/03/2022   LDLCALC 163 (H) 06/03/2022   TRIG 172 (H) 06/03/2022   CHOLHDL 4.7 (H) 06/03/2022   Last hemoglobin A1c No results found for: "HGBA1C"      Objective    BP 127/82   Pulse (!) 102   Temp 98.7 F (37.1 C)   Resp 20   Ht 5\' 4"  (1.626 m)   Wt 190 lb (86.2 kg)   SpO2 97%   BMI 32.61 kg/m  BP Readings from Last 3 Encounters:  02/06/24 127/82  07/19/23 110/80  06/03/22 118/70   Wt Readings from Last 3 Encounters:  02/06/24 190 lb (86.2 kg)   01/18/24 189 lb (85.7 kg)  07/19/23 191 lb (86.6 kg)        Physical Exam  Physical Exam   VITALS: T- 98.7, P- 102, SaO2- 97% MEASUREMENTS: BMI- 32.6. CHEST: Wheezing present, no crackles or fluid.       No results found for any visits on 02/06/24.  Assessment & Plan     Problem List Items Addressed This Visit       Respiratory   Bronchitis - Primary   Persistent cough with wheezing and chest congestion for the past week. Symptoms include productive cough with phlegm, dyspnea, and fatigue.  Negative COVID-19 tests. Currently on amoxicillin and Perles for cough. Slight improvement in wheezing and cough. Differential diagnosis includes bronchitis and possible pneumonia. Discussed potential need for chest x-ray to rule out pneumonia. Explained that albuterol inhaler and prednisone can help open airways and reduce inflammation, with prednisone potentially increasing appetite. Acute  - Order chest x-ray - Prescribe albuterol inhaler, 2 puffs every 4 hours as needed - Prescribe prednisone 40 mg once daily for 5 days - Provide work note until Wednesday      Relevant Medications   albuterol (VENTOLIN HFA) 108 (90 Base) MCG/ACT inhaler   predniSONE (DELTASONE) 20 MG tablet   Other Relevant Orders   DG Chest 2 View     Other   Obesity (BMI 30.0-34.9)   BMI of 32.6. Discussed weight loss medication options. Insurance likely to cover [place]. No family history of thyroid cancer or medullary thyroid cancer. Explained that [facility] can cause nausea, vomiting, diarrhea, and unusual burps, but some tolerate it well. Discussed need for prior authorization and potential delays. Chronic  - Prescribe Wegovy 0.25 mg for 4 weeks, then 0.5 mg for 4 weeks - Follow up in 6 weeks to assess weight progress and medication tolerance - Work on prior authorization for [employer]      Relevant Medications   Semaglutide-Weight Management 0.25 MG/0.5ML SOAJ   Semaglutide-Weight Management 0.5 MG/0.5ML SOAJ  (Start on 03/05/2024)   Mood disorder (HCC)   Chronic  Patient requesting refills on sleeping aid  PDMP reviewed, appropriate  Refilled temazepam 30mg  at bedtime       Relevant Medications   temazepam (RESTORIL) 30 MG capsule   Other Visit Diagnoses       Psychophysiological insomnia       Relevant Medications   temazepam (RESTORIL) 30 MG capsule        Dehydration Elevated heart rate (102-103 bpm) likely due to dehydration. Reports reduced fluid intake and poor appetite. Explained that dehydration can cause increased heart rate as the body compensates for reduced fluid volume. - Encourage increased fluid intake    Return in about 6 weeks (around 03/19/2024) for Weight MGMT.         Ronnald Ramp, MD  [facility] [phone] (phone) [phone] (fax)  [facility]

## 2024-02-06 NOTE — Assessment & Plan Note (Signed)
 Chronic  Patient requesting refills on sleeping aid  PDMP reviewed, appropriate  Refilled temazepam 30mg  at bedtime

## 2024-02-06 NOTE — Assessment & Plan Note (Signed)
 BMI of 32.6. Discussed weight loss medication options. Insurance likely to cover [place]. No family history of thyroid cancer or medullary thyroid cancer. Explained that [facility] can cause nausea, vomiting, diarrhea, and unusual burps, but some tolerate it well. Discussed need for prior authorization and potential delays. Chronic  - Prescribe Wegovy 0.25 mg for 4 weeks, then 0.5 mg for 4 weeks - Follow up in 6 weeks to assess weight progress and medication tolerance - Work on prior authorization for [employer]

## 2024-02-06 NOTE — Assessment & Plan Note (Signed)
 Persistent cough with wheezing and chest congestion for the past week. Symptoms include productive cough with phlegm, dyspnea, and fatigue. Negative COVID-19 tests. Currently on amoxicillin and Perles for cough. Slight improvement in wheezing and cough. Differential diagnosis includes bronchitis and possible pneumonia. Discussed potential need for chest x-ray to rule out pneumonia. Explained that albuterol inhaler and prednisone can help open airways and reduce inflammation, with prednisone potentially increasing appetite. Acute  - Order chest x-ray - Prescribe albuterol inhaler, 2 puffs every 4 hours as needed - Prescribe prednisone 40 mg once daily for 5 days - Provide work note until Wednesday

## 2024-03-07 ENCOUNTER — Other Ambulatory Visit (HOSPITAL_COMMUNITY): Payer: Self-pay

## 2024-03-07 ENCOUNTER — Telehealth: Payer: Self-pay

## 2024-03-07 NOTE — Telephone Encounter (Signed)
 Pharmacy Patient Advocate Encounter   Received notification from CoverMyMeds that prior authorization for Nurtec 75MG  dispersible tablets is required/requested.   Insurance verification completed.   The patient is insured through [facility] .   Per test claim: PA required; PA submitted to above mentioned insurance via CoverMyMeds Key/confirmation #/EOC (Key: [id number])      Status is pending

## 2024-03-08 ENCOUNTER — Other Ambulatory Visit (HOSPITAL_COMMUNITY): Payer: Self-pay

## 2024-03-08 NOTE — Telephone Encounter (Signed)
 Pharmacy Patient Advocate Encounter  Received notification from [facility] that Prior Authorization for {Nurtec 75MG  dispersible tablets has been APPROVED from 3.19.25 to 3.19.26. Ran test claim, Copay is $0.00. This test claim was processed through [facility]- copay amounts may vary at other pharmacies due to pharmacy/plan contracts, or as the patient moves through the different stages of their insurance plan.   PA #/Case ID/Reference #: (Key: [id number])

## 2024-03-21 ENCOUNTER — Ambulatory Visit: Payer: 59 | Admitting: Family Medicine

## 2024-04-02 ENCOUNTER — Other Ambulatory Visit: Payer: Self-pay | Admitting: Family Medicine

## 2024-04-02 DIAGNOSIS — F419 Anxiety disorder, unspecified: Secondary | ICD-10-CM

## 2024-04-03 NOTE — Telephone Encounter (Signed)
 Requested medication (s) are due for refill today: yes  Requested medication (s) are on the active medication list: yes  Last refill:  05/02/23 #30  Future visit scheduled: no  Notes to clinic:  last OV 02/06/24 ///med not delegated to RF   Requested Prescriptions  Pending Prescriptions Disp Refills   ALPRAZolam (XANAX) 0.5 MG tablet [Pharmacy Med Name: ALPRAZOLAM 0.5 MG TAB] 30 tablet     Sig: TAKE 1 TABLET BY MOUTH ONCE DAILY *NEED TO SCHEDULE OFFICE VISIT FOR FURTHER REFILLS*     Not Delegated - Psychiatry: Anxiolytics/Hypnotics 2 Failed - 04/03/2024  2:11 PM      Failed - This refill cannot be delegated      Failed - Urine Drug Screen completed in last 360 days      Passed - Patient is not pregnant      Passed - Valid encounter within last 6 months    Recent Outpatient Visits           1 month ago Bronchitis   [facility] [place], [place], MD   2 months ago Acute recurrent maxillary sinusitis   [facility] Lamon Pillow, MD

## 2024-04-04 ENCOUNTER — Other Ambulatory Visit: Payer: Self-pay | Admitting: Family Medicine

## 2024-04-04 DIAGNOSIS — F419 Anxiety disorder, unspecified: Secondary | ICD-10-CM

## 2024-04-05 ENCOUNTER — Other Ambulatory Visit: Payer: Self-pay

## 2024-04-05 ENCOUNTER — Encounter: Payer: Self-pay | Admitting: Family Medicine

## 2024-04-05 DIAGNOSIS — F419 Anxiety disorder, unspecified: Secondary | ICD-10-CM

## 2024-04-05 NOTE — Telephone Encounter (Signed)
 LOV 02/06/24 NOV none LRF 05/02/23 30 x 0

## 2024-04-06 NOTE — Telephone Encounter (Signed)
 Appt made for 04/12/2024 at 1pm for mychart visit

## 2024-04-06 NOTE — Telephone Encounter (Unsigned)
 Copied from CRM [id number]. Topic: Appointments - Scheduling Inquiry for Clinic >> Apr 06, 2024  1:44 PM Bascom RAMAN wrote: Reason for CRM: Patient needs to speak with provider about her medication. Appointment has been scheduled but if anything becomes available sooner, please reach out to the patient.

## 2024-04-12 ENCOUNTER — Telehealth (INDEPENDENT_AMBULATORY_CARE_PROVIDER_SITE_OTHER): Admitting: Family Medicine

## 2024-04-12 DIAGNOSIS — F39 Unspecified mood [affective] disorder: Secondary | ICD-10-CM | POA: Diagnosis not present

## 2024-04-12 DIAGNOSIS — E66811 Obesity, class 1: Secondary | ICD-10-CM

## 2024-04-12 DIAGNOSIS — Z6832 Body mass index (BMI) 32.0-32.9, adult: Secondary | ICD-10-CM

## 2024-04-12 DIAGNOSIS — E669 Obesity, unspecified: Secondary | ICD-10-CM | POA: Diagnosis not present

## 2024-04-12 DIAGNOSIS — F5104 Psychophysiologic insomnia: Secondary | ICD-10-CM

## 2024-04-12 MED ORDER — SERTRALINE HCL 100 MG PO TABS: ORAL_TABLET | ORAL | 1 refills | Status: DC

## 2024-04-12 MED ORDER — TEMAZEPAM 30 MG PO CAPS: 30.0000 mg | ORAL_CAPSULE | Freq: Every evening | ORAL | 3 refills | Status: AC | PRN

## 2024-04-12 NOTE — Assessment & Plan Note (Signed)
 Chronic  Recently worsened symptoms in setting of vasomotor symptoms associated with menopause  Depression Depression has intensified over the last few months, potentially due to hormonal fluctuations and work-related stress. She is currently on sertraline  (Zoloft ) 100 mg in the morning and 50 mg in the evening. She is uncertain about the regimen's efficacy and plans to evaluate it for another month before considering adjustments. - Continue sertraline  (Zoloft ) 100 mg in the morning and 50 mg in the evening.  Anxiety Anxiety levels have increased, possibly due to hormonal changes and hot flashes. Xanax  (alprazolam ) 0.5 mg is prescribed as needed, with the last refill in May 2024. She uses Xanax  infrequently, mainly for severe anxiety or depression episodes, and will clarify usage frequency later. - Refill Xanax  (alprazolam ) 0.5 mg as needed for anxiety.

## 2024-04-12 NOTE — Assessment & Plan Note (Addendum)
 She is on Semaglutide  0.5 mg weekly for weight management and has lost 10 pounds. She reports no side effects and adheres to diet and exercise recommendations.Last BMI 32.6 - Continue Semaglutide  0.5 mg weekly. - Encourage continued adherence to diet and exercise.

## 2024-04-12 NOTE — Progress Notes (Signed)
 MyChart Video Visit    Virtual Visit via Video Note   This format is felt to be most appropriate for this patient at this time. Physical exam was limited by quality of the video and audio technology used for the visit.   Patient location: Patient's home address   Provider location: [facility]  [address], [address]  [place], [place] [address]   I discussed the limitations of evaluation and management by telemedicine and the availability of in person appointments. The patient expressed understanding and agreed to proceed.  Patient: Elizabeth Munoz   DOB: [birth year]   58 y.o. Female  MRN: [id number] Visit Date: 04/12/2024  Today's healthcare provider: Mimi Alt, MD   Chief Complaint  Patient presents with   Depression   Insomnia   Subjective    HPI   Discussed the use of AI scribe software for clinical note transcription with the patient, who gave verbal consent to proceed.  History of Present Illness Elizabeth Munoz is a 58 year old female who presents for a virtual visit to discuss medication management for anxiety and depression.  She experiences increased anxiety, which she attributes to hormonal changes and hot flashes. She uses Xanax  0.5 mg as needed, primarily at night to aid with sleep, often taking only half a pill. Her last refill was in May 2024, and she is requesting a refill.  She reports increased depression over the past couple of months, possibly related to hormonal changes. She is currently taking Zoloft  (sertraline ) 100 mg in the morning and 50 mg in the evening. She is uncertain about the effectiveness of this medication after long-term use.  For sleep, she uses Restoril  30 mg as needed, typically taking half a pill if necessary. She initially tries Tylenol PM, but resorts to Restoril  if it is ineffective. Restoril  helps with her sleep issues.  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 She has an upcoming appointment with a gynecologist in June to address these symptoms.  She is on Ozempic  0.5 mg once a week for weight management and reports a weight loss of 10 pounds. No side effects from this medication and her diabetes is well-managed.      Past Medical History:  Diagnosis Date   Depression    GERD (gastroesophageal reflux disease)    H/O pyloric stenosis    06/24/2015 noted also by Dr. Oletta Berry   Insomnia    Migraine 06/19/2015   Perimenopause 05/29/2018   PMS (premenstrual syndrome) 06/22/2015    Medications: Outpatient Medications Prior to Visit  Medication Sig   albuterol  (VENTOLIN  HFA) 108 (90 Base) MCG/ACT inhaler Inhale 2 puffs into the lungs every 6 (six) hours as needed for wheezing or shortness of breath.   ALPRAZolam  (XANAX ) 0.5 MG tablet TAKE 1 TABLET BY MOUTH ONCE DAILY *NEED TO SCHEDULE OFFICE VISIT FOR FURTHER REFILLS*   azelastine  (ASTELIN ) 0.1 % nasal spray USE 1 SPRAY IN BOTH NOSTRILS DAILY AS DIRECTED.   baclofen  (LIORESAL ) 10 MG tablet Take 1 tablet (10 mg total) by mouth 3 (three) times daily.   benzonatate  (TESSALON ) 200 MG capsule Take 1 capsule (200 mg total) by mouth 2 (two) times daily as needed for cough.   butalbital-acetaminophen-caffeine (FIORICET, ESGIC) 50-325-40 MG tablet TAKE ONE TO TWO TABLETS EVERY FOUR HOURSAS NEEDED FOR HEADACHE   cetirizine  (ZYRTEC ) 10 MG tablet TAKE 1 TABLET BY MOUTH ONCE DAILY   clotrimazole -betamethasone  (LOTRISONE )  cream APPLY EXTERNALLY TWICE DAILY AS NEEDED FOR SYMPTOMS FOR UP TO 2 WEEKS   estradiol  (ESTRACE ) 0.5 MG tablet Take 1 tablet (0.5 mg total) by mouth daily.   fluticasone  (FLONASE ) 50 MCG/ACT nasal spray TAKE 2 SPRAYS INTO EACH NOSTRIL DAILY   furosemide  (LASIX ) 20 MG tablet Take by mouth.   gabapentin  (NEURONTIN ) 600 MG tablet TAKE 1 TABLET BY MOUTH NIGHTLY   meloxicam  (MOBIC ) 15 MG tablet Take 1 tablet (15 mg total) by mouth daily.    metoCLOPramide  (REGLAN ) 5 MG tablet TAKE ONE TABLET BY MOUTH 3 TIMES DAILY BEFORE MEALS   Multiple Vitamin (MULTIVITAMIN) tablet Take 1 tablet by mouth daily.   NURTEC 75 MG TBDP TAKE ONE TABLET BY MOUTH EVERY DAY   omeprazole  (PRILOSEC ) 20 MG capsule TAKE 1 CAPSULE BY MOUTH TWICE DAILY BEFORE MEAL   pantoprazole  (PROTONIX ) 40 MG tablet TAKE ONE TABLET BY MOUTH TWICE DAILY   polyethylene glycol powder (QC NATURA-LAX) 17 GM/SCOOP powder TAKE 17 GRAMS EVERY DAY   progesterone  (PROMETRIUM ) 100 MG capsule Take 1 cap nightly, 6 nights on, 1 night off   Semaglutide -Weight Management 0.5 MG/0.5ML SOAJ Inject 0.5 mg into the skin once a week. Inject 0.5mg  weekly into skin   sucralfate  (CARAFATE ) 1 g tablet TAKE ONE TABLET FOUR TIMES DAILY WITH MEALS AND BEDTIME   Verapamil  HCl CR 200 MG CP24 TAKE 1 CAPSULE BY MOUTH ONCE DAILY   [DISCONTINUED] sertraline  (ZOLOFT ) 100 MG tablet TAKE TWO TABLETS (200MG ) BY MOUTH EVERY DAY   [DISCONTINUED] Sertraline  HCl 200 MG CAPS Take 200 mg by mouth daily.   [DISCONTINUED] temazepam  (RESTORIL ) 30 MG capsule Take 1 capsule (30 mg total) by mouth at bedtime as needed.   No facility-administered medications prior to visit.    Review of Systems      Objective    There were no vitals taken for this visit.      Physical Exam Constitutional:      General: She is not in acute distress.    Appearance: Normal appearance. She is normal weight. She is not ill-appearing, toxic-appearing or diaphoretic.     Comments: Well groomed, calmly sitting in exam rooom  Pulmonary:     Effort: Pulmonary effort is normal. No respiratory distress.  Neurological:     Mental Status: She is alert and oriented to person, place, and time.  Psychiatric:        Attention and Perception: Attention and perception normal. She is attentive. She does not perceive auditory or visual hallucinations.        Mood and Affect: Mood and affect normal.        Speech: Speech normal.         Behavior: Behavior normal. Behavior is cooperative.        Thought Content: Thought content normal. Thought content is not paranoid or delusional. Thought content does not include homicidal or suicidal ideation. Thought content does not include homicidal or suicidal plan.        Judgment: Judgment normal.        Assessment & Plan     Problem List Items Addressed This Visit       Other   Obesity (BMI 30.0-34.9)   She is on Semaglutide  0.5 mg weekly for weight management and has lost 10 pounds. She reports no side effects and adheres to diet and exercise recommendations.Last BMI 32.6 - Continue Semaglutide  0.5 mg weekly. - Encourage continued adherence to diet and exercise.      Mood disorder (  HCC) - Primary   Chronic  Recently worsened symptoms in setting of vasomotor symptoms associated with menopause  Depression Depression has intensified over the last few months, potentially due to hormonal fluctuations and work-related stress. She is currently on sertraline  (Zoloft ) 100 mg in the morning and 50 mg in the evening. She is uncertain about the regimen's efficacy and plans to evaluate it for another month before considering adjustments. - Continue sertraline  (Zoloft ) 100 mg in the morning and 50 mg in the evening.  Anxiety Anxiety levels have increased, possibly due to hormonal changes and hot flashes. Xanax  (alprazolam ) 0.5 mg is prescribed as needed, with the last refill in May 2024. She uses Xanax  infrequently, mainly for severe anxiety or depression episodes, and will clarify usage frequency later. - Refill Xanax  (alprazolam ) 0.5 mg as needed for anxiety.      Relevant Medications   temazepam  (RESTORIL ) 30 MG capsule   sertraline  (ZOLOFT ) 100 MG tablet   Other Visit Diagnoses       Psychophysiological insomnia       Relevant Medications   temazepam  (RESTORIL ) 30 MG capsule        Assessment & Plan   Insomnia Insomnia is primarily attributed to hormonal changes and  hot flashes. She uses Restoril  (temazepam ) 30 mg as needed for sleep, typically taking half a pill, and reports difficulty sleeping despite trying Tylenol PM first. - Refill Restoril  (temazepam ) 30 mg as needed for sleep.       No follow-ups on file.     I discussed the assessment and treatment plan with the patient. The patient was provided an opportunity to ask questions and all were answered. The patient agreed with the plan and demonstrated an understanding of the instructions.   The patient was advised to call back or seek an in-person evaluation if the symptoms worsen or if the condition fails to improve as anticipated.  I provided 15 minutes of non-face-to-face time during this encounter.   Mimi Alt, MD [facility] [phone] (phone) [phone] (fax)  [facility]

## 2024-04-13 ENCOUNTER — Other Ambulatory Visit: Payer: Self-pay | Admitting: Family Medicine

## 2024-04-13 DIAGNOSIS — F5104 Psychophysiologic insomnia: Secondary | ICD-10-CM

## 2024-04-13 DIAGNOSIS — F419 Anxiety disorder, unspecified: Secondary | ICD-10-CM

## 2024-04-13 MED ORDER — ALPRAZOLAM 0.5 MG PO TABS: ORAL_TABLET | ORAL | 0 refills | Status: DC

## 2024-04-28 ENCOUNTER — Other Ambulatory Visit: Payer: Self-pay | Admitting: Family Medicine

## 2024-04-28 DIAGNOSIS — G43119 Migraine with aura, intractable, without status migrainosus: Secondary | ICD-10-CM

## 2024-05-01 NOTE — Telephone Encounter (Signed)
 Requested medication (s) are due for refill today:   Yes  Requested medication (s) are on the active medication list:   Yes  Future visit scheduled:   No.   Appt for 05/07/2024 cancelled by provider.   LOV 04/13/2024 virtual   Last ordered: 02/02/2024 #90, 0 refills  Unable to refill because cr. Due.    Last done in 2023.      Requested Prescriptions  Pending Prescriptions Disp Refills   gabapentin  (NEURONTIN ) 600 MG tablet [Pharmacy Med Name: GABAPENTIN  600 MG TAB] 90 tablet 0    Sig: TAKE 1 TABLET BY MOUTH NIGHTLY     Neurology: Anticonvulsants - gabapentin  Failed - 05/01/2024  9:13 AM      Failed - Cr in normal range and within 360 days    Creatinine, Ser  Date Value Ref Range Status  06/03/2022 0.63 0.57 - 1.00 mg/dL Final         Passed - Completed PHQ-2 or PHQ-9 in the last 360 days      Passed - Valid encounter within last 12 months    Recent Outpatient Visits           2 weeks ago Mood disorder [facility])   [place] [facility] [place], [place], MD   2 months ago Bronchitis    [facility] [place], [place], MD   3 months ago Acute recurrent maxillary sinusitis   [facility] Lamon Pillow, MD

## 2024-05-07 ENCOUNTER — Ambulatory Visit: Admitting: Family Medicine

## 2024-06-07 ENCOUNTER — Encounter: Payer: Self-pay | Admitting: Obstetrics and Gynecology

## 2024-06-07 ENCOUNTER — Ambulatory Visit: Admitting: Obstetrics and Gynecology

## 2024-06-07 VITALS — BP 117/75 | HR 91 | Ht 64.0 in | Wt 184.0 lb

## 2024-06-07 DIAGNOSIS — Z1231 Encounter for screening mammogram for malignant neoplasm of breast: Secondary | ICD-10-CM

## 2024-06-07 DIAGNOSIS — Z01419 Encounter for gynecological examination (general) (routine) without abnormal findings: Secondary | ICD-10-CM | POA: Diagnosis not present

## 2024-06-07 DIAGNOSIS — Z1211 Encounter for screening for malignant neoplasm of colon: Secondary | ICD-10-CM

## 2024-06-07 DIAGNOSIS — Z79890 Hormone replacement therapy (postmenopausal): Secondary | ICD-10-CM

## 2024-06-07 DIAGNOSIS — N951 Menopausal and female climacteric states: Secondary | ICD-10-CM

## 2024-06-07 MED ORDER — PROGESTERONE MICRONIZED 100 MG PO CAPS: ORAL_CAPSULE | ORAL | 3 refills | Status: DC

## 2024-06-07 MED ORDER — ESTRADIOL 0.5 MG PO TABS: 0.5000 mg | ORAL_TABLET | Freq: Every day | ORAL | 3 refills | Status: AC

## 2024-06-07 MED ORDER — PROGESTERONE MICRONIZED 100 MG PO CAPS: ORAL_CAPSULE | ORAL | 3 refills | Status: AC

## 2024-06-07 MED ORDER — ESTRADIOL 0.5 MG PO TABS: 0.5000 mg | ORAL_TABLET | Freq: Every day | ORAL | 3 refills | Status: DC

## 2024-06-07 NOTE — Patient Instructions (Signed)
 I value your feedback and you entrusting [place] with your care. If you get a [facility] patient survey, I would appreciate you taking the time to let us know about your experience today. Thank you!  [facility] ([place])--[phone]

## 2024-06-07 NOTE — Progress Notes (Signed)
 PCP: Mimi Alt, MD   Chief Complaint  Patient presents with   Gynecologic Exam    No concerns    HPI:      [name] is a 58 y.o. G2P0202 whose LMP was No LMP recorded. Patient is perimenopausal., presents today for her annual. Her menses are absent due to menopause, no PMB. Started on estradiol  0.5 mg and prometrium  100 mg QHS 12/22. Sx significantly improved.  Sx were hourly hot flashes and night sweats affecting sleep for close to a yr prior to HRT. Already on gabapentin  and zoloft  without sx relief. Wants to cont HRT.    Sex activity: not currently sexually active. No vag sx.  Last Pap: 06/03/22 Results were: no abnormalities /neg HPV DNA.  Hx of STDs: none  Last mammogram: 06/11/19 at [facility]: Results were: normal--routine follow-up in 12 months; didn't do past couple of yrs, plans to schedule today. There is no FH of breast cancer. There is no FH of ovarian cancer. The patient does do self-breast exams.  Colonoscopy: not done; pt wanted to wait till 40; has Cologuard kit at home from PCP, plans to do it.   Tobacco use: The patient denies current or previous tobacco use. Alcohol use: none No drug use Exercise: min active  She does get adequate calcium and Vitamin D in her diet.  Labs with PCP.  Hx of migraines without aura. Treated by PCP.  Past Medical History:  Diagnosis Date   Depression    GERD (gastroesophageal reflux disease)    H/O pyloric stenosis    06/24/2015 noted also by Dr. Oletta Berry   Insomnia    Migraine 06/19/2015   Perimenopause 05/29/2018   PMS (premenstrual syndrome) 06/22/2015    Past Surgical History:  Procedure Laterality Date   BREAST REDUCTION SURGERY     CESAREAN SECTION     x 2   OTHER SURGICAL HISTORY     eye surgery dec 2022 and jan 2023   TUBAL LIGATION     UPPER GI ENDOSCOPY     chronic gastritis, LA esophagitis    Family History  Problem Relation Age of Onset   Alzheimer's disease Mother     Diabetes Father    Prostate cancer Father    Diabetes Brother     Social History   Socioeconomic History   Marital status: Single    Spouse name: Not on file   Number of children: Not on file   Years of education: Not on file   Highest education level: Master's degree (e.g., MA, MS, MEng, MEd, MSW, MBA)  Occupational History   Not on file  Tobacco Use   Smoking status: Never   Smokeless tobacco: Never  Vaping Use   Vaping status: Never Used  Substance and Sexual Activity   Alcohol use: No    Alcohol/week: 0.0 standard drinks of alcohol   Drug use: No   Sexual activity: Not Currently    Birth control/protection: Surgical  Other Topics Concern   Not on file  Social History Narrative   Not on file   Social Drivers of Health   Financial Resource Strain: Patient Declined (04/12/2024)   Overall Financial Resource Strain (CARDIA)    Difficulty of Paying Living Expenses: Patient declined  Food Insecurity: Unknown (04/12/2024)   Hunger Vital Sign    Worried About Running Out of Food in the Last Year: Patient declined    Ran Out of Food in the Last Year: Not on file  Transportation Needs:  Patient Declined (04/12/2024)   PRAPARE - Administrator, Civil Service (Medical): Patient declined    Lack of Transportation (Non-Medical): Patient declined  Physical Activity: Not on file  Stress: Not on file  Social Connections: Unknown (04/12/2024)   Social Connection and Isolation Panel    Frequency of Communication with Friends and Family: Patient declined    Frequency of Social Gatherings with Friends and Family: Patient declined    Attends Religious Services: Patient declined    Database administrator or Organizations: Yes    Attends Banker Meetings: Patient declined    Marital Status: Patient declined  Catering manager Violence: Not on file    Outpatient Medications Prior to Visit  Medication Sig Dispense Refill   hyoscyamine (LEVSIN SL) 0.125 MG SL  tablet Place 0.125 mg under the tongue.     albuterol  (VENTOLIN  HFA) 108 (90 Base) MCG/ACT inhaler Inhale 2 puffs into the lungs every 6 (six) hours as needed for wheezing or shortness of breath. 8 g 2   ALPRAZolam  (XANAX ) 0.5 MG tablet TAKE 1/2-1 TABLET BY MOUTH ONCE DAILY as needed for anxiety 20 tablet 0   azelastine  (ASTELIN ) 0.1 % nasal spray USE 1 SPRAY IN BOTH NOSTRILS DAILY AS DIRECTED. 30 mL 0   baclofen  (LIORESAL ) 10 MG tablet Take 1 tablet (10 mg total) by mouth 3 (three) times daily. 30 each 0   butalbital-acetaminophen-caffeine (FIORICET, ESGIC) 50-325-40 MG tablet TAKE ONE TO TWO TABLETS EVERY FOUR HOURSAS NEEDED FOR HEADACHE 35 tablet 3   cetirizine  (ZYRTEC ) 10 MG tablet TAKE 1 TABLET BY MOUTH ONCE DAILY 90 tablet 0   clotrimazole -betamethasone  (LOTRISONE ) cream APPLY EXTERNALLY TWICE DAILY AS NEEDED FOR SYMPTOMS FOR UP TO 2 WEEKS 15 g 0   fluticasone  (FLONASE ) 50 MCG/ACT nasal spray TAKE 2 SPRAYS INTO EACH NOSTRIL DAILY 11.1 mL 6   furosemide  (LASIX ) 20 MG tablet Take by mouth.     gabapentin  (NEURONTIN ) 600 MG tablet TAKE 1 TABLET BY MOUTH NIGHTLY 90 tablet 0   meloxicam  (MOBIC ) 15 MG tablet Take 1 tablet (15 mg total) by mouth daily. 30 tablet 0   Multiple Vitamin (MULTIVITAMIN) tablet Take 1 tablet by mouth daily.     NURTEC 75 MG TBDP TAKE ONE TABLET BY MOUTH EVERY DAY 8 tablet 0   omeprazole  (PRILOSEC ) 20 MG capsule TAKE 1 CAPSULE BY MOUTH TWICE DAILY BEFORE MEAL 180 capsule 0   pantoprazole  (PROTONIX ) 40 MG tablet TAKE ONE TABLET BY MOUTH TWICE DAILY 60 tablet 0   polyethylene glycol powder (QC NATURA-LAX) 17 GM/SCOOP powder TAKE 17 GRAMS EVERY DAY 510 g 6   Semaglutide -Weight Management 0.5 MG/0.5ML SOAJ Inject 0.5 mg into the skin once a week. Inject 0.5mg  weekly into skin 2 mL 0   sertraline  (ZOLOFT ) 100 MG tablet Take 1 tablet (100 mg total) by mouth every morning AND 0.5 tablets (50 mg total) every evening. 135 tablet 1   sucralfate  (CARAFATE ) 1 g tablet TAKE ONE TABLET  FOUR TIMES DAILY WITH MEALS AND BEDTIME 240 tablet 4   temazepam  (RESTORIL ) 30 MG capsule Take 1 capsule (30 mg total) by mouth at bedtime as needed. 30 capsule 3   Verapamil  HCl CR 200 MG CP24 TAKE 1 CAPSULE BY MOUTH ONCE DAILY 30 capsule 2   benzonatate  (TESSALON ) 200 MG capsule Take 1 capsule (200 mg total) by mouth 2 (two) times daily as needed for cough. 20 capsule 0   estradiol  (ESTRACE ) 0.5 MG tablet Take 1 tablet (0.5  mg total) by mouth daily. 90 tablet 3   metoCLOPramide  (REGLAN ) 5 MG tablet TAKE ONE TABLET BY MOUTH 3 TIMES DAILY BEFORE MEALS 90 tablet 2   progesterone  (PROMETRIUM ) 100 MG capsule Take 1 cap nightly, 6 nights on, 1 night off 90 capsule 3   No facility-administered medications prior to visit.     ROS:  Review of Systems  Constitutional:  Negative for fatigue, fever and unexpected weight change.  Respiratory:  Negative for cough, shortness of breath and wheezing.   Cardiovascular:  Negative for chest pain, palpitations and leg swelling.  Gastrointestinal:  Negative for blood in stool, constipation, diarrhea, nausea and vomiting.  Endocrine: Negative for cold intolerance, heat intolerance and polyuria.  Genitourinary:  Negative for dyspareunia, dysuria, flank pain, frequency, genital sores, hematuria, menstrual problem, pelvic pain, urgency, vaginal bleeding, vaginal discharge and vaginal pain.  Musculoskeletal:  Negative for back pain, joint swelling and myalgias.  Skin:  Negative for rash.  Neurological:  Negative for dizziness, syncope, light-headedness, numbness and headaches.  Hematological:  Negative for adenopathy.  Psychiatric/Behavioral:  Negative for agitation, confusion, dysphoric mood, sleep disturbance and suicidal ideas. The patient is not nervous/anxious.   BREAST: No symptoms   Objective: BP 117/75   Pulse 91   Ht 5' 4 (1.626 m)   Wt 184 lb (83.5 kg)   BMI 31.58 kg/m    Physical Exam Constitutional:      Appearance: She is well-developed.   Genitourinary:     Vulva normal.     Right Labia: No rash, tenderness or lesions.    Left Labia: No tenderness, lesions or rash.    No vaginal discharge, erythema or tenderness.      Right Adnexa: not tender and no mass present.    Left Adnexa: not tender and no mass present.    No cervical motion tenderness, friability or polyp.     Uterus is not enlarged or tender.  Breasts:    Right: No mass, nipple discharge, skin change or tenderness.     Left: No mass, nipple discharge, skin change or tenderness.  Neck:     Thyroid: No thyromegaly.   Cardiovascular:     Rate and Rhythm: Normal rate and regular rhythm.     Heart sounds: Normal heart sounds. No murmur heard. Pulmonary:     Effort: Pulmonary effort is normal.     Breath sounds: Normal breath sounds.  Abdominal:     Palpations: Abdomen is soft.     Tenderness: There is no abdominal tenderness. There is no guarding or rebound.   Musculoskeletal:        General: Normal range of motion.     Cervical back: Normal range of motion.  Lymphadenopathy:     Cervical: No cervical adenopathy.   Neurological:     General: No focal deficit present.     Mental Status: She is alert and oriented to person, place, and time.     Cranial Nerves: No cranial nerve deficit.   Skin:    General: Skin is warm and dry.   Psychiatric:        Mood and Affect: Mood normal.        Behavior: Behavior normal.        Thought Content: Thought content normal.        Judgment: Judgment normal.  Vitals reviewed.     Assessment/Plan:  Encounter for annual routine gynecological examination  Encounter for screening mammogram for malignant neoplasm of breast - Plan: MM 3D  SCREENING MAMMOGRAM BILATERAL BREAST; pt to schedule mammo at [facility]  Screening for colon cancer--encouraged pt to do cologuard this yr  Hormone replacement therapy (HRT) - Plan: estradiol  (ESTRACE ) 0.5 MG tablet, progesterone  (PROMETRIUM ) 100 MG capsule, Rx RF eRxd/   Vasomotor  symptoms due to menopause - Plan: estradiol  (ESTRACE ) 0.5 MG tablet, progesterone  (PROMETRIUM ) 100 MG capsule, Rx RF eRxd, doing well.    Meds ordered this encounter  Medications                                           estradiol  (ESTRACE ) 0.5 MG tablet    Sig: Take 1 tablet (0.5 mg total) by mouth daily.    Dispense:  90 tablet    Refill:  3    Supervising Provider:   ROBY, MICIA [[id number]]   progesterone  (PROMETRIUM ) 100 MG capsule    Sig: Take 1 cap nightly, 6 nights on, 1 night off    Dispense:  90 capsule    Refill:  3    Supervising Provider:   ROBY, MICIA [[id number]]          GYN counsel breast self exam, mammography screening, menopause, adequate intake of calcium and vitamin D, diet and exercise    F/U  Return in about 1 year (around 06/07/2025).  Iyonna Rish B. Diavion Labrador, PA-C 06/07/2024 10:40 AM

## 2024-06-13 ENCOUNTER — Other Ambulatory Visit: Payer: Self-pay | Admitting: Family Medicine

## 2024-06-13 DIAGNOSIS — F419 Anxiety disorder, unspecified: Secondary | ICD-10-CM

## 2024-06-13 DIAGNOSIS — F5104 Psychophysiologic insomnia: Secondary | ICD-10-CM

## 2024-07-02 ENCOUNTER — Encounter: Payer: Self-pay | Admitting: Family Medicine

## 2024-07-10 ENCOUNTER — Other Ambulatory Visit: Payer: Self-pay | Admitting: Family Medicine

## 2024-07-11 ENCOUNTER — Other Ambulatory Visit: Payer: Self-pay | Admitting: Family Medicine

## 2024-07-11 NOTE — Telephone Encounter (Signed)
 [facility] pharmacy faxed refill request for the following medications:   Verapamil  HCl CR 200 MG CP24     Please advise

## 2024-07-12 MED ORDER — VERAPAMIL HCL ER 200 MG PO CP24: 1.0000 | ORAL_CAPSULE | Freq: Every day | ORAL | 0 refills | Status: AC

## 2024-07-16 ENCOUNTER — Other Ambulatory Visit: Payer: Self-pay | Admitting: Family Medicine

## 2024-08-02 ENCOUNTER — Other Ambulatory Visit: Payer: Self-pay | Admitting: Family Medicine

## 2024-08-02 DIAGNOSIS — G43119 Migraine with aura, intractable, without status migrainosus: Secondary | ICD-10-CM

## 2024-08-11 ENCOUNTER — Other Ambulatory Visit: Payer: Self-pay | Admitting: Family Medicine

## 2024-08-11 DIAGNOSIS — F39 Unspecified mood [affective] disorder: Secondary | ICD-10-CM

## 2024-10-01 ENCOUNTER — Telehealth: Admitting: Family Medicine

## 2024-10-01 ENCOUNTER — Ambulatory Visit: Payer: Self-pay

## 2024-10-01 DIAGNOSIS — M545 Low back pain, unspecified: Secondary | ICD-10-CM

## 2024-10-01 MED ORDER — NAPROXEN 500 MG PO TABS: 500.0000 mg | ORAL_TABLET | Freq: Two times a day (BID) | ORAL | 0 refills | Status: AC

## 2024-10-01 MED ORDER — CYCLOBENZAPRINE HCL 10 MG PO TABS: 10.0000 mg | ORAL_TABLET | Freq: Three times a day (TID) | ORAL | 0 refills | Status: AC | PRN

## 2024-10-01 NOTE — Patient Instructions (Signed)
 Low Back Sprain or Strain Rehab Ask your health care provider which exercises are safe for you. Do exercises exactly as told by your health care provider and adjust them as directed. It is normal to feel mild stretching, pulling, tightness, or discomfort as you do these exercises. Stop right away if you feel sudden pain or your pain gets worse. Do not begin these exercises until told by your health care provider. Stretching and range-of-motion exercises These exercises warm up your muscles and joints and improve the movement and flexibility of your back. These exercises also help to relieve pain, numbness, and tingling. Lumbar rotation  Lie on your back on a firm bed or the floor with your knees bent. Straighten your arms out to your sides so each arm forms a 90-degree angle (right angle) with a side of your body. Slowly move (rotate) both of your knees to one side of your body until you feel a stretch in your lower back (lumbar). Try not to let your shoulders lift off the floor. Hold this position for __________ seconds. Tense your abdominal muscles and slowly move your knees back to the starting position. Repeat this exercise on the other side of your body. Repeat __________ times. Complete this exercise __________ times a day. Single knee to chest  Lie on your back on a firm bed or the floor with both legs straight. Bend one of your knees. Use your hands to move your knee up toward your chest until you feel a gentle stretch in your lower back and buttock. Hold your leg in this position by holding on to the front of your knee. Keep your other leg as straight as possible. Hold this position for __________ seconds. Slowly return to the starting position. Repeat with your other leg. Repeat __________ times. Complete this exercise __________ times a day. Prone extension on elbows  Lie on your abdomen on a firm bed or the floor (prone position). Prop yourself up on your elbows. Use your arms  to help lift your chest up until you feel a gentle stretch in your abdomen and your lower back. This will place some of your body weight on your elbows. If this is uncomfortable, try stacking pillows under your chest. Your hips should stay down, against the surface that you are lying on. Keep your hip and back muscles relaxed. Hold this position for __________ seconds. Slowly relax your upper body and return to the starting position. Repeat __________ times. Complete this exercise __________ times a day. Strengthening exercises These exercises build strength and endurance in your back. Endurance is the ability to use your muscles for a long time, even after they get tired. Pelvic tilt This exercise strengthens the muscles that lie deep in the abdomen. Lie on your back on a firm bed or the floor with your legs extended. Bend your knees so they are pointing toward the ceiling and your feet are flat on the floor. Tighten your lower abdominal muscles to press your lower back against the floor. This motion will tilt your pelvis so your tailbone points up toward the ceiling instead of pointing to your feet or the floor. To help with this exercise, you may place a small towel under your lower back and try to push your back into the towel. Hold this position for __________ seconds. Let your muscles relax completely before you repeat this exercise. Repeat __________ times. Complete this exercise __________ times a day. Alternating arm and leg raises  Get on your hands  and knees on a firm surface. If you are on a hard floor, you may want to use padding, such as an exercise mat, to cushion your knees. Line up your arms and legs. Your hands should be directly below your shoulders, and your knees should be directly below your hips. Lift your left leg behind you. At the same time, raise your right arm and straighten it in front of you. Do not lift your leg higher than your hip. Do not lift your arm higher  than your shoulder. Keep your abdominal and back muscles tight. Keep your hips facing the ground. Do not arch your back. Keep your balance carefully, and do not hold your breath. Hold this position for __________ seconds. Slowly return to the starting position. Repeat with your right leg and your left arm. Repeat __________ times. Complete this exercise __________ times a day. Abdominal set with straight leg raise  Lie on your back on a firm bed or the floor. Bend one of your knees and keep your other leg straight. Tense your abdominal muscles and lift your straight leg up, 4-6 inches (10-15 cm) off the ground. Keep your abdominal muscles tight and hold this position for __________ seconds. Do not hold your breath. Do not arch your back. Keep it flat against the ground. Keep your abdominal muscles tense as you slowly lower your leg back to the starting position. Repeat with your other leg. Repeat __________ times. Complete this exercise __________ times a day. Single leg lower with bent knees Lie on your back on a firm bed or the floor. Tense your abdominal muscles and lift your feet off the floor, one foot at a time, so your knees and hips are bent in 90-degree angles (right angles). Your knees should be over your hips and your lower legs should be parallel to the floor. Keeping your abdominal muscles tense and your knee bent, slowly lower one of your legs so your toe touches the ground. Lift your leg back up to return to the starting position. Do not hold your breath. Do not let your back arch. Keep your back flat against the ground. Repeat with your other leg. Repeat __________ times. Complete this exercise __________ times a day. Posture and body mechanics Good posture and healthy body mechanics can help to relieve stress in your body's tissues and joints. Body mechanics refers to the movements and positions of your body while you do your daily activities. Posture is part of body  mechanics. Good posture means: Your spine is in its natural S-curve position (neutral). Your shoulders are pulled back slightly. Your head is not tipped forward (neutral). Follow these guidelines to improve your posture and body mechanics in your everyday activities. Standing  When standing, keep your spine neutral and your feet about hip-width apart. Keep a slight bend in your knees. Your ears, shoulders, and hips should line up. When you do a task in which you stand in one place for a long time, place one foot up on a stable object that is 2-4 inches (5-10 cm) high, such as a footstool. This helps keep your spine neutral. Sitting  When sitting, keep your spine neutral and keep your feet flat on the floor. Use a footrest, if necessary, and keep your thighs parallel to the floor. Avoid rounding your shoulders, and avoid tilting your head forward. When working at a desk or a computer, keep your desk at a height where your hands are slightly lower than your elbows. Slide your  chair under your desk so you are close enough to maintain good posture. When working at a computer, place your monitor at a height where you are looking straight ahead and you do not have to tilt your head forward or downward to look at the screen. Resting When lying down and resting, avoid positions that are most painful for you. If you have pain with activities such as sitting, bending, stooping, or squatting, lie in a position in which your body does not bend very much. For example, avoid curling up on your side with your arms and knees near your chest (fetal position). If you have pain with activities such as standing for a long time or reaching with your arms, lie with your spine in a neutral position and bend your knees slightly. Try the following positions: Lying on your side with a pillow between your knees. Lying on your back with a pillow under your knees. Lifting  When lifting objects, keep your feet at least  shoulder-width apart and tighten your abdominal muscles. Bend your knees and hips and keep your spine neutral. It is important to lift using the strength of your legs, not your back. Do not lock your knees straight out. Always ask for help to lift heavy or awkward objects. This information is not intended to replace advice given to you by your health care provider. Make sure you discuss any questions you have with your health care provider. Document Revised: 04/11/2023 Document Reviewed: 02/23/2021 Elsevier Patient Education  2024 [employer].

## 2024-10-01 NOTE — Telephone Encounter (Signed)
 FYI Only or Action Required?: FYI only for provider.  Patient was last seen in primary care on 04/12/2024 by Sharma Coyer, MD.  Called Nurse Triage reporting Back Pain.  Symptoms began yesterday.  Interventions attempted: OTC medications: Aleve , Tylenol PM, Rest, hydration, or home remedies, and Ice/heat application.  Symptoms are: unchanged.  Triage Disposition: See PCP When Office is Open (Within 3 Days)  Patient/caregiver understands and will follow disposition?: Yes Reason for Disposition  [1] MODERATE back pain (e.g., interferes with normal activities) AND [2] present > 3 days  Answer Assessment - Initial Assessment Questions Used heating bad, taken Aleve  and Tylenol PM.   1. ONSET: When did the pain begin? (e.g., minutes, hours, days)     Yesterday  2. LOCATION: Where does it hurt? (upper, mid or lower back)     Lower back, mostly on right side  3. SEVERITY: How bad is the pain?  (e.g., Scale 1-10; mild, moderate, or severe)     Very bad, stayed in bed all day yesterday. 9/10  4. PATTERN: Is the pain constant? (e.g., yes, no; constant, intermittent)      Constant, does not feel pain when sitting, hurts really bad when standing / walking  5. RADIATION: Does the pain shoot into your legs or somewhere else?     Radiates a little bit into right leg  6. CAUSE:  What do you think is causing the back pain?      Unsure  7. BACK OVERUSE:  Any recent lifting of heavy objects, strenuous work or exercise?     Denies  8. MEDICINES: What have you taken so far for the pain? (e.g., nothing, acetaminophen, NSAIDS)     Aleve , Tylenol PM  9. NEUROLOGIC SYMPTOMS: Do you have any weakness, numbness, or problems with bowel/bladder control?     Denies  10. OTHER SYMPTOMS: Do you have any other symptoms? (e.g., fever, abdomen pain, burning with urination, blood in urine)       Denies  Protocols used: Back Pain-A-AH  Copied from CRM #[id number]. Topic:  Clinical - Red Word Triage >> Oct 01, 2024 10:46 AM Lonell PEDLAR wrote: Patient called with complaints of severe low back pain, unable to walk, getting worse.

## 2024-10-01 NOTE — Progress Notes (Signed)
 Virtual Visit Consent   Elizabeth Munoz, you are scheduled for a virtual visit with a [place] provider today. Just as with appointments in the office, your consent must be obtained to participate. Your consent will be active for this visit and any virtual visit you may have with one of our providers in the next 365 days. If you have a MyChart account, a copy of this consent can be sent to you electronically.  As this is a virtual visit, video technology does not allow for your provider to perform a traditional examination. This may limit your provider's ability to fully assess your condition. If your provider identifies any concerns that need to be evaluated in person or the need to arrange testing (such as labs, EKG, etc.), we will make arrangements to do so. Although advances in technology are sophisticated, we cannot ensure that it will always work on either your end or our end. If the connection with a video visit is poor, the visit may have to be switched to a telephone visit. With either a video or telephone visit, we are not always able to ensure that we have a secure connection.  By engaging in this virtual visit, you consent to the provision of healthcare and authorize for your insurance to be billed (if applicable) for the services provided during this visit. Depending on your insurance coverage, you may receive a charge related to this service.  I need to obtain your verbal consent now. Are you willing to proceed with your visit today? Elizabeth Munoz has provided verbal consent on 10/01/2024 for a virtual visit (video or telephone). Loa Lamp, FNP  Date: 10/01/2024 4:00 PM   Virtual Visit via Video Note   I, Loa Lamp, connected with  Elizabeth Munoz  ([id number], [birth year]) on 10/01/24 at  4:00 PM EDT by a video-enabled telemedicine application and verified that I am speaking with the correct person using two identifiers.  Location: Patient: Virtual Visit Location  Patient: Home Provider: Virtual Visit Location Provider: Home Office   I discussed the limitations of evaluation and management by telemedicine and the availability of in person appointments. The patient expressed understanding and agreed to proceed.    History of Present Illness: Elizabeth Munoz is a 58 y.o. who identifies as a female who was assigned female at birth, and is being seen today for low back pain with no injury. She has has this is the past. No radicular sx. Started yesterday. She wants what was given the last time. SABRA  HPI: HPI  Problems:  Patient Active Problem List   Diagnosis Date Noted   Obesity (BMI 30.0-34.9) 02/06/2024   Bronchitis 02/06/2024   Vasomotor symptoms due to menopause 05/23/2019   Adaptation reaction 10/13/2016   H/O pyloric stenosis    Menometrorrhagia 06/22/2015   Mood disorder 06/22/2015   Migraine 06/19/2015   GERD (gastroesophageal reflux disease)     Allergies:  Allergies  Allergen Reactions   Imitrex  [Sumatriptan ]     Stomach pain, nausea, vomiting, felt bad   Medications:  Current Outpatient Medications:    albuterol  (VENTOLIN  HFA) 108 (90 Base) MCG/ACT inhaler, Inhale 2 puffs into the lungs every 6 (six) hours as needed for wheezing or shortness of breath., Disp: 8 g, Rfl: 2   ALPRAZolam  (XANAX ) 0.5 MG tablet, TAKE 1/2-1 TABLET BY MOUTH ONCE DAILY ASNEEDED FOR ANXIETY, Disp: 20 tablet, Rfl: 2   azelastine  (ASTELIN ) 0.1 % nasal spray, USE 1 SPRAY IN BOTH NOSTRILS DAILY AS DIRECTED., Disp: 30 mL,  Rfl: 0   baclofen  (LIORESAL ) 10 MG tablet, Take 1 tablet (10 mg total) by mouth 3 (three) times daily., Disp: 30 each, Rfl: 0   butalbital-acetaminophen-caffeine (FIORICET, ESGIC) 50-325-40 MG tablet, TAKE ONE TO TWO TABLETS EVERY FOUR HOURSAS NEEDED FOR HEADACHE, Disp: 35 tablet, Rfl: 3   cetirizine  (ZYRTEC ) 10 MG tablet, TAKE 1 TABLET BY MOUTH ONCE DAILY, Disp: 90 tablet, Rfl: 0   clotrimazole -betamethasone  (LOTRISONE ) cream, APPLY EXTERNALLY  TWICE DAILY AS NEEDED FOR SYMPTOMS FOR UP TO 2 WEEKS, Disp: 15 g, Rfl: 0   estradiol  (ESTRACE ) 0.5 MG tablet, Take 1 tablet (0.5 mg total) by mouth daily., Disp: 90 tablet, Rfl: 3   fluticasone  (FLONASE ) 50 MCG/ACT nasal spray, TAKE 2 SPRAYS INTO EACH NOSTRIL DAILY, Disp: 11.1 mL, Rfl: 6   furosemide  (LASIX ) 20 MG tablet, Take by mouth., Disp: , Rfl:    gabapentin  (NEURONTIN ) 600 MG tablet, TAKE 1 TABLET BY MOUTH NIGHTLY, Disp: 90 tablet, Rfl: 0   hyoscyamine (LEVSIN SL) 0.125 MG SL tablet, Place 0.125 mg under the tongue., Disp: , Rfl:    meloxicam  (MOBIC ) 15 MG tablet, Take 1 tablet (15 mg total) by mouth daily., Disp: 30 tablet, Rfl: 0   Multiple Vitamin (MULTIVITAMIN) tablet, Take 1 tablet by mouth daily., Disp: , Rfl:    NURTEC 75 MG TBDP, TAKE ONE TABLET BY MOUTH EVERY DAY, Disp: 8 tablet, Rfl: 0   omeprazole  (PRILOSEC ) 20 MG capsule, TAKE 1 CAPSULE BY MOUTH TWICE DAILY BEFORE MEAL, Disp: 180 capsule, Rfl: 0   pantoprazole  (PROTONIX ) 40 MG tablet, TAKE ONE TABLET BY MOUTH TWICE DAILY, Disp: 60 tablet, Rfl: 0   polyethylene glycol powder (QC NATURA-LAX) 17 GM/SCOOP powder, TAKE 17 GRAMS EVERY DAY, Disp: 510 g, Rfl: 6   progesterone  (PROMETRIUM ) 100 MG capsule, Take 1 cap nightly, 6 nights on, 1 night off, Disp: 90 capsule, Rfl: 3   Semaglutide -Weight Management 0.5 MG/0.5ML SOAJ, Inject 0.5 mg into the skin once a week. Inject 0.5mg  weekly into skin, Disp: 2 mL, Rfl: 0   sertraline  (ZOLOFT ) 100 MG tablet, TAKE ONE TABLET (100 MG) BY MOUTH EVERY MORNING AND TAKE ONE-HALF TABLET (50 MG) EVERY EVENING, Disp: 135 tablet, Rfl: 0   sucralfate  (CARAFATE ) 1 g tablet, TAKE ONE TABLET FOUR TIMES DAILY WITH MEALS AND BEDTIME, Disp: 240 tablet, Rfl: 4   temazepam  (RESTORIL ) 30 MG capsule, Take 1 capsule (30 mg total) by mouth at bedtime as needed., Disp: 30 capsule, Rfl: 3   Verapamil  HCl CR 200 MG CP24, Take 1 capsule (200 mg total) by mouth daily., Disp: 90 capsule, Rfl:  0  Observations/Objective: Patient is well-developed, well-nourished in no acute distress.  Resting comfortably  at home.  Head is normocephalic, atraumatic.  No labored breathing.  Speech is clear and coherent with logical content.  Patient is alert and oriented at baseline.    Assessment and Plan: 1. Acute bilateral low back pain without sciatica (Primary)  Heat, stretching and UC if sx worsen.   Follow Up Instructions: I discussed the assessment and treatment plan with the patient. The patient was provided an opportunity to ask questions and all were answered. The patient agreed with the plan and demonstrated an understanding of the instructions.  A copy of instructions were sent to the patient via MyChart unless otherwise noted below.     The patient was advised to call back or seek an in-person evaluation if the symptoms worsen or if the condition fails to improve as anticipated.  Deborha Moseley, FNP

## 2024-10-03 NOTE — Telephone Encounter (Signed)
 I have reviewed patient call and agree with recommendations

## 2024-10-22 ENCOUNTER — Other Ambulatory Visit: Payer: Self-pay | Admitting: Family Medicine

## 2024-10-22 DIAGNOSIS — J0101 Acute recurrent maxillary sinusitis: Secondary | ICD-10-CM

## 2024-11-02 ENCOUNTER — Other Ambulatory Visit: Payer: Self-pay | Admitting: Family Medicine

## 2024-11-02 DIAGNOSIS — G43119 Migraine with aura, intractable, without status migrainosus: Secondary | ICD-10-CM

## 2024-12-19 ENCOUNTER — Other Ambulatory Visit: Payer: Self-pay | Admitting: Family Medicine

## 2024-12-19 DIAGNOSIS — F419 Anxiety disorder, unspecified: Secondary | ICD-10-CM

## 2024-12-19 DIAGNOSIS — F5104 Psychophysiologic insomnia: Secondary | ICD-10-CM

## 2024-12-19 NOTE — Telephone Encounter (Signed)
 Requested medication (s) are due for refill today: Yes  Requested medication (s) are on the active medication list: Yes  Last refill:  06/13/24  Future visit scheduled: No  Notes to clinic:  Unable to refill per protocol, cannot delegate.      Requested Prescriptions  Pending Prescriptions Disp Refills   ALPRAZolam  (XANAX ) 0.5 MG tablet [Pharmacy Med Name: ALPRAZOLAM  0.5 MG TAB] 20 tablet     Sig: TAKE 1/2-1 TABLET BY MOUTH ONCE DAILY ASNEEDED FOR ANXIETY     Not Delegated - Psychiatry: Anxiolytics/Hypnotics 2 Failed - 12/19/2024  5:48 PM      Failed - This refill cannot be delegated      Failed - Urine Drug Screen completed in last 360 days      Failed - Valid encounter within last 6 months    Recent Outpatient Visits           8 months ago Mood disorder   [place] [facility] Simmons[place]Robinson, [place], MD   10 months ago Bronchitis   [place] [facility] [place], [place], MD   10 months ago Acute recurrent maxillary sinusitis   [place] [facility] Gasper Nancyann BRAVO, MD              Passed - Patient is not pregnant

## 2025-01-06 ENCOUNTER — Other Ambulatory Visit: Payer: Self-pay | Admitting: Family Medicine

## 2025-01-06 DIAGNOSIS — G43119 Migraine with aura, intractable, without status migrainosus: Secondary | ICD-10-CM
